# Patient Record
Sex: Female | Born: 1995 | Hispanic: No | Marital: Single | Smoking: Never smoker
Health system: Southern US, Community
[De-identification: ages and names within clinical notes are randomized; demographics above are authoritative.]

## PROBLEM LIST (undated history)

## (undated) DIAGNOSIS — E119 Type 2 diabetes mellitus without complications: Secondary | ICD-10-CM

## (undated) HISTORY — PX: NO PAST SURGERIES: SHX2092

---
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
## 2015-05-31 ENCOUNTER — Encounter (HOSPITAL_COMMUNITY): Payer: Self-pay | Admitting: Emergency Medicine

## 2015-05-31 ENCOUNTER — Emergency Department (HOSPITAL_COMMUNITY)
Admission: EM | Admit: 2015-05-31 | Discharge: 2015-05-31 | Disposition: A | Discharge status: Home / Self Care | Payer: Medicaid Other | Attending: Emergency Medicine | Admitting: Emergency Medicine

## 2015-05-31 DIAGNOSIS — B9689 Other specified bacterial agents as the cause of diseases classified elsewhere: Secondary | ICD-10-CM

## 2015-05-31 DIAGNOSIS — K529 Noninfective gastroenteritis and colitis, unspecified: Secondary | ICD-10-CM | POA: Insufficient documentation

## 2015-05-31 DIAGNOSIS — Z3202 Encounter for pregnancy test, result negative: Secondary | ICD-10-CM | POA: Insufficient documentation

## 2015-05-31 DIAGNOSIS — N760 Acute vaginitis: Secondary | ICD-10-CM | POA: Diagnosis not present

## 2015-05-31 DIAGNOSIS — E119 Type 2 diabetes mellitus without complications: Secondary | ICD-10-CM | POA: Diagnosis not present

## 2015-05-31 DIAGNOSIS — R112 Nausea with vomiting, unspecified: Secondary | ICD-10-CM | POA: Diagnosis present

## 2015-05-31 HISTORY — DX: Type 2 diabetes mellitus without complications: E11.9

## 2015-05-31 LAB — COMPREHENSIVE METABOLIC PANEL
ALK PHOS: 62 U/L (ref 38–126)
ALT: 12 U/L — ABNORMAL LOW (ref 14–54)
ANION GAP: 8 (ref 5–15)
AST: 16 U/L (ref 15–41)
Albumin: 3.3 g/dL — ABNORMAL LOW (ref 3.5–5.0)
BILIRUBIN TOTAL: 0.4 mg/dL (ref 0.3–1.2)
BUN: 11 mg/dL (ref 6–20)
CALCIUM: 8.9 mg/dL (ref 8.9–10.3)
CO2: 25 mmol/L (ref 22–32)
CREATININE: 0.59 mg/dL (ref 0.44–1.00)
Chloride: 101 mmol/L (ref 101–111)
GFR calc non Af Amer: >60 mL/min (ref >60)
Glucose, Bld: 344 mg/dL — ABNORMAL HIGH (ref 65–99)
Potassium: 3.6 mmol/L (ref 3.5–5.1)
Sodium: 134 mmol/L — ABNORMAL LOW (ref 135–145)
TOTAL PROTEIN: 6.3 g/dL — AB (ref 6.5–8.1)

## 2015-05-31 LAB — CBC
HCT: 38 % (ref 36.0–46.0)
HEMOGLOBIN: 11.8 g/dL — AB (ref 12.0–15.0)
MCH: 23 pg — ABNORMAL LOW (ref 26.0–34.0)
MCHC: 31.1 g/dL (ref 30.0–36.0)
MCV: 73.9 fL — ABNORMAL LOW (ref 78.0–100.0)
PLATELETS: 281 10*3/uL (ref 150–400)
RBC: 5.14 MIL/uL — AB (ref 3.87–5.11)
RDW: 15.7 % — ABNORMAL HIGH (ref 11.5–15.5)
WBC: 8.6 10*3/uL (ref 4.0–10.5)

## 2015-05-31 LAB — URINE MICROSCOPIC-ADD ON

## 2015-05-31 LAB — URINALYSIS, ROUTINE W REFLEX MICROSCOPIC
BILIRUBIN URINE: NEGATIVE
Glucose, UA: >1000 mg/dL — AB
Hgb urine dipstick: NEGATIVE
Ketones, ur: NEGATIVE mg/dL
NITRITE: NEGATIVE
PROTEIN: NEGATIVE mg/dL
SPECIFIC GRAVITY, URINE: 1.039 — AB (ref 1.005–1.030)
pH: 5 (ref 5.0–8.0)

## 2015-05-31 LAB — I-STAT BETA HCG BLOOD, ED (MC, WL, AP ONLY)

## 2015-05-31 LAB — LIPASE, BLOOD: Lipase: 24 U/L (ref 11–51)

## 2015-05-31 MED ORDER — METRONIDAZOLE 500 MG PO TABS: 500.0000 mg | ORAL_TABLET | Freq: Two times a day (BID) | ORAL | Status: DC

## 2015-05-31 MED ORDER — ONDANSETRON HCL 4 MG PO TABS
4.0000 mg | ORAL_TABLET | Freq: Once | ORAL | Status: AC
  Administered 2015-05-31: 4 mg via ORAL
  Filled 2015-05-31: qty 1

## 2015-05-31 MED ORDER — ONDANSETRON HCL 4 MG PO TABS: 4.0000 mg | ORAL_TABLET | Freq: Four times a day (QID) | ORAL | Status: DC

## 2015-05-31 NOTE — ED Provider Notes (Signed)
CSN: [id number]     Arrival date & time 05/31/15  1419 History  By signing my name below, I, Placido Sou, attest that this documentation has been prepared under the direction and in the presence of [employer], PA-C. Electronically Signed: Placido Sou, ED Scribe. 05/31/2015. 5:31 PM.    Chief Complaint  Patient presents with  . Emesis  . Diarrhea   The history is provided by the patient. No language interpreter was used.    HPI Comments: Tanya Torres is a 20 y.o. female who presents to the Emergency Department complaining of n/v/d with onset 1 day ago. She notes associated, 5/10, RLQ abd pain when vomiting and with palpation that all but alleviates s/p each episode. She notes 4x vomiting and 4x diarrhea in the past 24 hours. Pt denies any sick contacts. She denies fevers, chills, vaginal discharge or any other associated symptoms at this time.   Past Medical History  Diagnosis Date  . Diabetes mellitus without complication (HCC)    History reviewed. No pertinent past surgical history. History reviewed. No pertinent family history. Social History  Substance Use Topics  . Smoking status: Never Smoker   . Smokeless tobacco: None  . Alcohol Use: No   OB History    No data available     Review of Systems  All other systems reviewed and are negative.  Allergies  Review of patient's allergies indicates no known allergies.  Home Medications   Prior to Admission medications   Medication Sig Start Date End Date Taking? Authorizing Provider  metroNIDAZOLE (FLAGYL) 500 MG tablet Take 1 tablet (500 mg total) by mouth 2 (two) times daily. 05/31/15   Tinnie Gens Zainab Crumrine, PA-C  ondansetron (ZOFRAN) 4 MG tablet Take 1 tablet (4 mg total) by mouth every 6 (six) hours. 05/31/15   Orra Nolde, PA-C   BP 124/83 mmHg  Pulse 82  Temp(Src) 98.1 F (36.7 C) (Oral)  Resp 16  Ht 4\' 11"  (1.499 m)  Wt 94.938 kg  BMI 42.25 kg/m2  SpO2 98%    Physical Exam  Constitutional: She is  oriented to person, place, and time. She appears well-developed and well-nourished.  HENT:  Head: Normocephalic and atraumatic.  Neck: Normal range of motion.  Cardiovascular: Normal rate.   Pulmonary/Chest: Effort normal and breath sounds normal. No respiratory distress. She has no wheezes. She has no rales.  Abdominal: Soft. She exhibits no mass. There is tenderness. There is no rebound and no guarding.  Very minor TTP of the RLQ  Musculoskeletal: Normal range of motion.  Neurological: She is alert and oriented to person, place, and time.  Skin: Skin is warm and dry. She is not diaphoretic.  Psychiatric: She has a normal mood and affect. Her behavior is normal.  Nursing note and vitals reviewed.   ED Course  Procedures  DIAGNOSTIC STUDIES: Oxygen Saturation is 98% on RA, normal by my interpretation.    COORDINATION OF CARE: 5:14 PM Pt presents today due to vomiting and diarrhea. Discussed next steps with pt including Zofran and reevaluation after food and drink. Pt understands and is agreeable to the plan.   6:23 PM Pt reevaluated and was tolerating food and drink well and requests discharge. She refused pelvic exam and would like to follow up with her PCP regarding STD testing.   Labs Review Labs Reviewed  COMPREHENSIVE METABOLIC PANEL - Abnormal; Notable for the following:    Sodium 134 (*)    Glucose, Bld 344 (*)    Total Protein  6.3 (*)    Albumin 3.3 (*)    ALT 12 (*)    All other components within normal limits  CBC - Abnormal; Notable for the following:    RBC 5.14 (*)    Hemoglobin 11.8 (*)    MCV 73.9 (*)    MCH 23.0 (*)    RDW 15.7 (*)    All other components within normal limits  URINALYSIS, ROUTINE W REFLEX MICROSCOPIC (NOT AT [facility]) - Abnormal; Notable for the following:    APPearance HAZY (*)    Specific Gravity, Urine 1.039 (*)    Glucose, UA >1000 (*)    Leukocytes, UA SMALL (*)    All other components within normal limits  URINE MICROSCOPIC-ADD ON -  Abnormal; Notable for the following:    Squamous Epithelial / LPF 6-30 (*)    Bacteria, UA FEW (*)    All other components within normal limits  LIPASE, BLOOD  I-STAT BETA HCG BLOOD, ED ([facility], [facility], [facility] ONLY)    Imaging Review No results found. I have personally reviewed and evaluated these lab results as part of my medical decision-making.   EKG Interpretation None      MDM   Final diagnoses:  Gastroenteritis  Bacterial vaginosis    Labs: UA, urine microscopic, I-stat, lipase, CMP, CBC  Imaging: none indicated  Consults: none  Therapeutics: Zofran , metronidazole  Assessment: Patient's presentation most consistent with gastroenteritis. Nausea vomiting and diarrhea, she is afebrile, nontoxic with reassuring vital signs. Incidentally on urine she was noted to have trichomoniasis. I discussed potential for PID, bacterial infections, and need for pelvic exam. Patient reports that she would like to have her primary care perform the pelvic exam and refused evaluation here. Patient will be given metronidazole, Zofran as needed.   Plan: Pt given strict return precautions, verbalized understanding and agreement to today's plan and had no further questions or concerns at the time of discharge.   I personally performed the services described in this documentation, which was scribed in my presence. The recorded information has been reviewed and is accurate.    Eyvonne MechanicJeffrey Maryam Feely, PA-C 05/31/15 1837  Benjiman CoreNathan Pickering, MD 05/31/15 [phone]

## 2015-05-31 NOTE — Discharge Instructions (Signed)
Please read attached information. If you experience any new or worsening signs or symptoms please return to the emergency room for evaluation. Please follow-up with your primary care provider or specialist as discussed. Please use medication prescribed only as directed and discontinue taking if you have any concerning signs or symptoms.   °

## 2015-05-31 NOTE — ED Notes (Signed)
Pt A&OX4, ambulatory at d/c with steady gait, NAD and she reports she has all of her belongings with her at d/c 

## 2015-05-31 NOTE — ED Notes (Signed)
Pt sts N/V/D x 1 day

## 2017-01-13 ENCOUNTER — Encounter (HOSPITAL_COMMUNITY): Payer: Self-pay | Admitting: Emergency Medicine

## 2017-01-13 ENCOUNTER — Emergency Department (HOSPITAL_COMMUNITY)
Admission: EM | Admit: 2017-01-13 | Discharge: 2017-01-13 | Disposition: A | Discharge status: Home / Self Care | Payer: No Typology Code available for payment source | Attending: Emergency Medicine | Admitting: Emergency Medicine

## 2017-01-13 DIAGNOSIS — M7918 Myalgia, other site: Secondary | ICD-10-CM

## 2017-01-13 DIAGNOSIS — Y92410 Unspecified street and highway as the place of occurrence of the external cause: Secondary | ICD-10-CM | POA: Diagnosis not present

## 2017-01-13 DIAGNOSIS — Y9389 Activity, other specified: Secondary | ICD-10-CM | POA: Insufficient documentation

## 2017-01-13 DIAGNOSIS — Y999 Unspecified external cause status: Secondary | ICD-10-CM | POA: Diagnosis not present

## 2017-01-13 DIAGNOSIS — Z79899 Other long term (current) drug therapy: Secondary | ICD-10-CM | POA: Diagnosis not present

## 2017-01-13 DIAGNOSIS — M791 Myalgia: Secondary | ICD-10-CM | POA: Insufficient documentation

## 2017-01-13 DIAGNOSIS — E119 Type 2 diabetes mellitus without complications: Secondary | ICD-10-CM | POA: Insufficient documentation

## 2017-01-13 MED ORDER — METHOCARBAMOL 500 MG PO TABS: 500.0000 mg | ORAL_TABLET | Freq: Two times a day (BID) | ORAL | 0 refills | Status: DC

## 2017-01-13 MED ORDER — IBUPROFEN 200 MG PO TABS
600.0000 mg | ORAL_TABLET | Freq: Once | ORAL | Status: AC
  Administered 2017-01-13: 600 mg via ORAL
  Filled 2017-01-13: qty 3

## 2017-01-13 MED ORDER — IBUPROFEN 600 MG PO TABS: 600.0000 mg | ORAL_TABLET | Freq: Four times a day (QID) | ORAL | 0 refills | Status: DC | PRN

## 2017-01-13 NOTE — Discharge Instructions (Signed)
Return if any problems.
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
## 2018-08-22 ENCOUNTER — Other Ambulatory Visit: Payer: Self-pay

## 2018-08-22 ENCOUNTER — Encounter (HOSPITAL_COMMUNITY): Payer: Self-pay | Admitting: Emergency Medicine

## 2018-08-22 ENCOUNTER — Emergency Department (HOSPITAL_COMMUNITY): Payer: Self-pay

## 2018-08-22 ENCOUNTER — Emergency Department (HOSPITAL_COMMUNITY)
Admission: EM | Admit: 2018-08-22 | Discharge: 2018-08-22 | Disposition: A | Discharge status: Home / Self Care | Payer: Self-pay | Attending: Emergency Medicine | Admitting: Emergency Medicine

## 2018-08-22 DIAGNOSIS — N390 Urinary tract infection, site not specified: Secondary | ICD-10-CM | POA: Insufficient documentation

## 2018-08-22 DIAGNOSIS — R1010 Upper abdominal pain, unspecified: Secondary | ICD-10-CM

## 2018-08-22 DIAGNOSIS — R1012 Left upper quadrant pain: Secondary | ICD-10-CM | POA: Insufficient documentation

## 2018-08-22 DIAGNOSIS — R1031 Right lower quadrant pain: Secondary | ICD-10-CM | POA: Insufficient documentation

## 2018-08-22 LAB — URINALYSIS, ROUTINE W REFLEX MICROSCOPIC
BILIRUBIN URINE: NEGATIVE
GLUCOSE, UA: NEGATIVE mg/dL
KETONES UR: NEGATIVE mg/dL
NITRITE: NEGATIVE
PH: 5 (ref 5.0–8.0)
Protein, ur: NEGATIVE mg/dL
SPECIFIC GRAVITY, URINE: 1.016 (ref 1.005–1.030)

## 2018-08-22 LAB — COMPREHENSIVE METABOLIC PANEL
ALBUMIN: 3.9 g/dL (ref 3.5–5.0)
ALT: 30 U/L (ref 0–44)
ANION GAP: 8 (ref 5–15)
AST: 45 U/L — AB (ref 15–41)
Alkaline Phosphatase: 70 U/L (ref 38–126)
BILIRUBIN TOTAL: 1.7 mg/dL — AB (ref 0.3–1.2)
BUN: 9 mg/dL (ref 6–20)
CHLORIDE: 104 mmol/L (ref 98–111)
CO2: 25 mmol/L (ref 22–32)
Calcium: 9.6 mg/dL (ref 8.9–10.3)
Creatinine, Ser: 0.73 mg/dL (ref 0.44–1.00)
GFR calc Af Amer: >60 mL/min (ref >60)
Glucose, Bld: 151 mg/dL — ABNORMAL HIGH (ref 70–99)
POTASSIUM: 4.6 mmol/L (ref 3.5–5.1)
Sodium: 137 mmol/L (ref 135–145)
TOTAL PROTEIN: 7.5 g/dL (ref 6.5–8.1)

## 2018-08-22 LAB — CBC
HEMATOCRIT: 43 % (ref 36.0–46.0)
Hemoglobin: 13.4 g/dL (ref 12.0–15.0)
MCH: 24 pg — ABNORMAL LOW (ref 26.0–34.0)
MCHC: 31.2 g/dL (ref 30.0–36.0)
MCV: 77.1 fL — AB (ref 80.0–100.0)
NRBC: 0 % (ref 0.0–0.2)
PLATELETS: 310 10*3/uL (ref 150–400)
RBC: 5.58 MIL/uL — ABNORMAL HIGH (ref 3.87–5.11)
RDW: 14.8 % (ref 11.5–15.5)
WBC: 9.4 10*3/uL (ref 4.0–10.5)

## 2018-08-22 LAB — LIPASE, BLOOD: LIPASE: 23 U/L (ref 11–51)

## 2018-08-22 LAB — PREGNANCY, URINE: Preg Test, Ur: NEGATIVE

## 2018-08-22 MED ORDER — CEPHALEXIN 500 MG PO CAPS: 500.0000 mg | ORAL_CAPSULE | Freq: Four times a day (QID) | ORAL | 0 refills | Status: DC

## 2018-08-22 MED ORDER — CEPHALEXIN 250 MG PO CAPS
500.0000 mg | ORAL_CAPSULE | Freq: Once | ORAL | Status: AC
  Administered 2018-08-22: 500 mg via ORAL
  Filled 2018-08-22: qty 2

## 2018-08-22 NOTE — ED Provider Notes (Signed)
[facility] EMERGENCY DEPARTMENT Provider Note   CSN: [id number] Arrival date & time: 08/22/18  1449    History   Chief Complaint Chief Complaint  Patient presents with  . Abdominal Pain    HPI Tanya Torres is a 23 y.o. female.     Patient c/o intermittent left upper and right lower abd pain for the past few days. Symptoms gradual onset, moderate, episodic, non radiating. Denies hx same pain. No specific exacerbating or alleviating factors. Not related to eating. Having normal bms including today. No abd distension or prior abd surgery. No vaginal discharge or bleeding. Last normal period approx 6 weeks ago - pt indicates took home preg test neg. Denies dysuria or hematuria. No fever or chills. Denies cough or uri symptoms.   The history is provided by the patient.  Abdominal Pain  Associated symptoms: no chest pain, no chills, no constipation, no cough, no diarrhea, no dysuria, no fever, no shortness of breath, no sore throat and no vomiting     Past Medical History:  Diagnosis Date  . Diabetes mellitus without complication (HCC)     There are no active problems to display for this patient.   History reviewed. No pertinent surgical history.   OB History   No obstetric history on file.      Home Medications    Prior to Admission medications   Medication Sig Start Date End Date Taking? Authorizing Provider  ibuprofen (ADVIL,MOTRIN) 600 MG tablet Take 1 tablet (600 mg total) by mouth every 6 (six) hours as needed. 01/13/17   Elson Areas, PA-C  methocarbamol (ROBAXIN) 500 MG tablet Take 1 tablet (500 mg total) by mouth 2 (two) times daily. 01/13/17   Elson Areas, PA-C  metroNIDAZOLE (FLAGYL) 500 MG tablet Take 1 tablet (500 mg total) by mouth 2 (two) times daily. 05/31/15   Hedges, Tinnie Gens, PA-C  ondansetron (ZOFRAN) 4 MG tablet Take 1 tablet (4 mg total) by mouth every 6 (six) hours. 05/31/15   Eyvonne Mechanic, PA-C    Family History No family  history on file.  Social History Social History   Tobacco Use  . Smoking status: Never Smoker  . Smokeless tobacco: Never Used  Substance Use Topics  . Alcohol use: Yes    Comment: occasionally  . Drug use: No     Allergies   Patient has no known allergies.   Review of Systems Review of Systems  Constitutional: Negative for chills and fever.  HENT: Negative for sore throat.   Eyes: Negative for redness.  Respiratory: Negative for cough and shortness of breath.   Cardiovascular: Negative for chest pain.  Gastrointestinal: Positive for abdominal pain. Negative for constipation, diarrhea and vomiting.  Endocrine: Negative for polyuria.  Genitourinary: Negative for dysuria and flank pain.  Musculoskeletal: Negative for back pain and neck pain.  Skin: Negative for rash.  Neurological: Negative for headaches.  Hematological: Does not bruise/bleed easily.  Psychiatric/Behavioral: Negative for confusion.     Physical Exam Updated Vital Signs BP (!) 139/93   Pulse 100   Temp 98.4 F (36.9 C) (Oral)   Resp 16   SpO2 100%   Physical Exam Vitals signs and nursing note reviewed.  Constitutional:      Appearance: Normal appearance. She is well-developed.  HENT:     Head: Atraumatic.     Nose: Nose normal.     Mouth/Throat:     Mouth: Mucous membranes are moist.  Eyes:     General:  No scleral icterus.    Conjunctiva/sclera: Conjunctivae normal.  Neck:     Musculoskeletal: Normal range of motion and neck supple. No neck rigidity or muscular tenderness.     Trachea: No tracheal deviation.  Cardiovascular:     Rate and Rhythm: Normal rate and regular rhythm.     Pulses: Normal pulses.     Heart sounds: Normal heart sounds. No murmur. No friction rub. No gallop.   Pulmonary:     Effort: Pulmonary effort is normal. No respiratory distress.     Breath sounds: Normal breath sounds.  Abdominal:     General: Bowel sounds are normal. There is no distension.      Palpations: Abdomen is soft. There is no mass.     Tenderness: There is no abdominal tenderness. There is no guarding or rebound.     Hernia: No hernia is present.  Genitourinary:    Comments: No cva tenderness.  Musculoskeletal:        General: No swelling.  Skin:    General: Skin is warm and dry.     Findings: No rash.  Neurological:     Mental Status: She is alert.     Comments: Alert, speech normal.   Psychiatric:        Mood and Affect: Mood normal.      ED Treatments / Results  Labs (all labs ordered are listed, but only abnormal results are displayed) Results for orders placed or performed during the hospital encounter of 08/22/18  CBC  Result Value Ref Range   WBC 9.4 4.0 - 10.5 K/uL   RBC 5.58 (H) 3.87 - 5.11 MIL/uL   Hemoglobin 13.4 12.0 - 15.0 g/dL   HCT 25.6 38.9 - 37.3 %   MCV 77.1 (L) 80.0 - 100.0 fL   MCH 24.0 (L) 26.0 - 34.0 pg   MCHC 31.2 30.0 - 36.0 g/dL   RDW 42.8 76.8 - 11.5 %   Platelets 310 150 - 400 K/uL   nRBC 0.0 0.0 - 0.2 %  Comprehensive metabolic panel  Result Value Ref Range   Sodium 137 135 - 145 mmol/L   Potassium 4.6 3.5 - 5.1 mmol/L   Chloride 104 98 - 111 mmol/L   CO2 25 22 - 32 mmol/L   Glucose, Bld 151 (H) 70 - 99 mg/dL   BUN 9 6 - 20 mg/dL   Creatinine, Ser 7.26 0.44 - 1.00 mg/dL   Calcium 9.6 8.9 - 20.3 mg/dL   Total Protein 7.5 6.5 - 8.1 g/dL   Albumin 3.9 3.5 - 5.0 g/dL   AST 45 (H) 15 - 41 U/L   ALT 30 0 - 44 U/L   Alkaline Phosphatase 70 38 - 126 U/L   Total Bilirubin 1.7 (H) 0.3 - 1.2 mg/dL   GFR calc non Af Amer >60 >60 mL/min   GFR calc Af Amer >60 >60 mL/min   Anion gap 8 5 - 15  Lipase, blood  Result Value Ref Range   Lipase 23 11 - 51 U/L  Urinalysis, Routine w reflex microscopic  Result Value Ref Range   Color, Urine YELLOW YELLOW   APPearance HAZY (A) CLEAR   Specific Gravity, Urine 1.016 1.005 - 1.030   pH 5.0 5.0 - 8.0   Glucose, UA NEGATIVE NEGATIVE mg/dL   Hgb urine dipstick SMALL (A) NEGATIVE    Bilirubin Urine NEGATIVE NEGATIVE   Ketones, ur NEGATIVE NEGATIVE mg/dL   Protein, ur NEGATIVE NEGATIVE mg/dL   Nitrite NEGATIVE NEGATIVE  Leukocytes,Ua MODERATE (A) NEGATIVE   RBC / HPF 6-10 0 - 5 RBC/hpf   WBC, UA 11-20 0 - 5 WBC/hpf   Bacteria, UA FEW (A) NONE SEEN   Squamous Epithelial / LPF 6-10 0 - 5   Mucus PRESENT   Pregnancy, urine  Result Value Ref Range   Preg Test, Ur NEGATIVE NEGATIVE    EKG None  Radiology [place] Abdomen Limited  Result Date: 08/22/2018 CLINICAL DATA:  Upper abdominal pain.  Evaluate for gallstones. EXAM: ULTRASOUND ABDOMEN LIMITED RIGHT UPPER QUADRANT COMPARISON:  None. FINDINGS: Gallbladder: No gallstones or wall thickening visualized. No sonographic Murphy sign noted by sonographer. Common bile duct: Diameter: 2.9 mm Liver: No focal lesion identified. Within normal limits in parenchymal echogenicity. Portal vein is patent on color Doppler imaging with normal direction of blood flow towards the liver. IMPRESSION: Negative. Electronically Signed   By: Elsie StainJohn T Curnes M.D.   On: 08/22/2018 18:15    Procedures Procedures (including critical care time)  Medications Ordered in ED Medications - No data to display   Initial Impression / Assessment and Plan / ED Course  I have reviewed the triage vital signs and the nursing notes.  Pertinent labs & imaging results that were available during my care of the patient were reviewed by me and considered in my medical decision making (see chart for details).  Labs sent.   Reviewed nursing notes and prior charts for additional history.   Labs reviewed - possible uti, LE pos, 11-20 wbc - will tx.  Wbc normal, lytes normal. u preg negative.   Given upper abd pain will get u/s r/o gallstones.   Recheck abd soft nt. No nv. Afebrile.   U/s reviewed - no gallstones, negative acute.  Keflex po. rx for home.  Abd soft nt.  Pt appears stable for d/c.    Final Clinical Impressions(s) / ED Diagnoses   Final  diagnoses:  None    ED Discharge Orders    None       Cathren LaineSteinl, Telina Kleckley, MD 08/22/18 1850

## 2018-08-22 NOTE — ED Notes (Signed)
Discharge instructions and prescription discussed with Pt. Pt verbalized understanding. Pt stable and ambulatory.    

## 2018-08-22 NOTE — ED Triage Notes (Addendum)
Lower abd pain  X a couple days some nausea, and her cycle is 8 days late , denies dysuria, last bm normal this am ,  Took home preg test  Not preg she states deines vag d/c

## 2018-08-22 NOTE — Discharge Instructions (Addendum)
It was our pleasure to provide your ER care today - we hope that you feel better.  Your lab tests show a possible urine infection - take antibiotic as prescribed.   From the lab tests, a couple of your liver tests were mildly elevated (AST 45, bilirubin 1.7) - follow up with primary care doctor.   Return to ER if worse, new symptoms, severe pain, persistent vomiting, other concern.

## 2019-08-31 ENCOUNTER — Encounter (HOSPITAL_COMMUNITY): Payer: Self-pay | Admitting: Emergency Medicine

## 2019-08-31 ENCOUNTER — Emergency Department (HOSPITAL_COMMUNITY)
Admission: EM | Admit: 2019-08-31 | Discharge: 2019-08-31 | Disposition: A | Discharge status: Home / Self Care | Payer: Medicaid Other | Attending: Emergency Medicine | Admitting: Emergency Medicine

## 2019-08-31 DIAGNOSIS — L03012 Cellulitis of left finger: Secondary | ICD-10-CM | POA: Insufficient documentation

## 2019-08-31 DIAGNOSIS — E119 Type 2 diabetes mellitus without complications: Secondary | ICD-10-CM | POA: Insufficient documentation

## 2019-08-31 MED ORDER — SULFAMETHOXAZOLE-TRIMETHOPRIM 800-160 MG PO TABS: 1.0000 | ORAL_TABLET | Freq: Two times a day (BID) | ORAL | 0 refills | Status: AC

## 2019-08-31 MED ORDER — ACETAMINOPHEN 325 MG PO TABS
650.0000 mg | ORAL_TABLET | Freq: Once | ORAL | Status: AC
  Administered 2019-08-31: 650 mg via ORAL
  Filled 2019-08-31: qty 2

## 2019-08-31 MED ORDER — BUPIVACAINE HCL (PF) 0.5 % IJ SOLN
10.0000 mL | Freq: Once | INTRAMUSCULAR | Status: AC
  Administered 2019-08-31: 17:00:00 10 mL
  Filled 2019-08-31: qty 10

## 2019-08-31 MED ORDER — IBUPROFEN 800 MG PO TABS
800.0000 mg | ORAL_TABLET | Freq: Once | ORAL | Status: AC
  Administered 2019-08-31: 17:00:00 800 mg via ORAL
  Filled 2019-08-31: qty 1

## 2019-08-31 NOTE — Discharge Instructions (Signed)
Per our discussion, you are being prescribed Bactrim which is an antibiotic for the infection in your finger.  Please remember that this medication is not safe to take if you are pregnant.  Based on our conversation you are certain that you are not pregnant.  If there is any concern, please be sure to take a pregnancy test and make sure it is negative.  Please continue to apply warm compresses to the finger as needed.  Also, please take ibuprofen and acetaminophen as needed for the pain in your finger.  Please return to the emergency department with any new or worsening symptoms.

## 2019-08-31 NOTE — ED Triage Notes (Signed)
Pt reports pain to left middle finger about a week ago. Pt started soaking finger and noticed some swelling.

## 2019-08-31 NOTE — ED Provider Notes (Signed)
[facility] EMERGENCY DEPARTMENT Provider Note   CSN: [id number] Arrival date & time: 08/31/19  1505     History Chief Complaint  Patient presents with  . Finger Injury    Taiwana Willison is a 24 y.o. female.  HPI HPI Comments: Shona Pardo is a 24 y.o. female who presents to the Emergency Department complaining of worsening pain and swelling at the distal tip of the left third digit.  Patient states her symptoms started about 4 days ago and then noted a small pustule just proximal to the nailbed on the affected finger about 3 days ago.  She has been applying warm compresses to the region and taking ibuprofen 800 mg 3 times daily without significant relief.  Her last dose of ibuprofen was yesterday.  She states her pain is 10/10 and is throbbing.  She does not bite her nails and denies going to nail salon's.  She does have a history of diabetes mellitus.  She denies any other complaints including fevers, chills, URI symptoms, chest pain, shortness of breath, abdominal pain, nausea, vomiting, diarrhea.    Past Medical History:  Diagnosis Date  . Diabetes mellitus without complication (HCC)     There are no problems to display for this patient.   History reviewed. No pertinent surgical history.   OB History   No obstetric history on file.     No family history on file.  Social History   Tobacco Use  . Smoking status: Never Smoker  . Smokeless tobacco: Never Used  Substance Use Topics  . Alcohol use: Yes    Comment: occasionally  . Drug use: No    Home Medications Prior to Admission medications   Medication Sig Start Date End Date Taking? Authorizing Provider  acetaminophen (TYLENOL) 325 MG tablet Take 650 mg by mouth every 6 (six) hours as needed for mild pain or headache.    [provider]  cephALEXin (KEFLEX) 500 MG capsule Take 1 capsule (500 mg total) by mouth 4 (four) times daily. 08/22/18   Cathren Laine, MD  ibuprofen (ADVIL,MOTRIN)  600 MG tablet Take 1 tablet (600 mg total) by mouth every 6 (six) hours as needed. Patient not taking: Reported on 08/22/2018 01/13/17   Elson Areas, PA-C  insulin aspart (NOVOLOG) 100 UNIT/ML FlexPen Inject 10 Units into the skin daily.    [provider]  insulin glargine (LANTUS) 100 unit/mL SOPN Inject 18 Units into the skin daily.    [provider]  methocarbamol (ROBAXIN) 500 MG tablet Take 1 tablet (500 mg total) by mouth 2 (two) times daily. Patient not taking: Reported on 08/22/2018 01/13/17   Elson Areas, PA-C  metroNIDAZOLE (FLAGYL) 500 MG tablet Take 1 tablet (500 mg total) by mouth 2 (two) times daily. Patient not taking: Reported on 08/22/2018 05/31/15   Hedges, Tinnie Gens, PA-C  ondansetron (ZOFRAN) 4 MG tablet Take 1 tablet (4 mg total) by mouth every 6 (six) hours. Patient not taking: Reported on 08/22/2018 05/31/15   Hedges, Tinnie Gens, PA-C  sulfamethoxazole-trimethoprim (BACTRIM DS) 800-160 MG tablet Take 1 tablet by mouth 2 (two) times daily for 7 days. 08/31/19 09/07/19  Placido Sou, PA-C    Allergies    Patient has no known allergies.  Review of Systems   Review of Systems  Constitutional: Negative for chills and fever.  HENT: Negative for congestion and rhinorrhea.   Respiratory: Negative for shortness of breath.   Cardiovascular: Negative for chest pain.  Gastrointestinal: Negative for abdominal pain,  diarrhea, nausea and vomiting.  Musculoskeletal: Positive for arthralgias and myalgias.  Skin: Positive for color change and wound.   Physical Exam Updated Vital Signs BP 138/86   Pulse 100   Temp 98.2 F (36.8 C) (Oral)   Resp 16   Ht 4\' 11"  (1.499 m)   Wt 97.5 kg   SpO2 98%   BMI 43.42 kg/m   Physical Exam Vitals and nursing note reviewed.  Constitutional:      General: She is in acute distress.     Appearance: Normal appearance. She is not ill-appearing, toxic-appearing or diaphoretic.     Comments: Pleasant well-developed 24 year old  female in significant pain.  She is sitting upright speaking clearly and coherently.  HENT:     Head: Normocephalic and atraumatic.     Nose: Nose normal.  Eyes:     Extraocular Movements: Extraocular movements intact.  Cardiovascular:     Rate and Rhythm: Normal rate.  Pulmonary:     Effort: Pulmonary effort is normal.  Musculoskeletal:        General: Normal range of motion.     Cervical back: Normal range of motion.  Skin:    General: Skin is warm and dry.     Capillary Refill: Capillary refill takes less than 2 seconds.     Comments: Exquisite TTP noted to the distal phalanx of the left third digit.  Unable to assess range of motion due to pain.  Pustule noted just proximal to the nailbed on the lateral aspect.  Mild erythema surrounding this region.  Distal sensation intact.  2+ radial pulses noted bilaterally.  Neurological:     General: No focal deficit present.     Mental Status: She is alert and oriented to person, place, and time.  Psychiatric:        Mood and Affect: Mood normal.        Behavior: Behavior normal.     ED Results / Procedures / Treatments   Labs (all labs ordered are listed, but only abnormal results are displayed) Labs Reviewed - No data to display  EKG None  Radiology No results found.  Procedures .Marland KitchenIncision and Drainage  Date/Time: 08/31/2019 5:24 PM Performed by: Rayna Sexton, PA-C Authorized by: Rayna Sexton, PA-C   Consent:    Consent obtained:  Verbal   Consent given by:  Patient   Risks discussed:  Bleeding, incomplete drainage, pain and infection Location:    Indications for incision and drainage: Paronychia.   Location:  Upper extremity   Upper extremity location:  Finger   Finger location:  L long finger Pre-procedure details:    Skin preparation:  Betadine Anesthesia (see MAR for exact dosages):    Anesthesia method:  Local infiltration   Local anesthetic:  Bupivacaine 0.5% w/o epi Procedure type:    Complexity:   Simple Procedure details:    Incision types:  Stab incision   Incision depth:  Dermal   Scalpel blade:  11   Wound management:  Probed and deloculated and extensive cleaning   Drainage:  Bloody and purulent   Drainage amount:  Scant   Wound treatment:  Wound left open   Packing materials:  None Post-procedure details:    Patient tolerance of procedure:  Tolerated well, no immediate complications    Medications Ordered in ED Medications  acetaminophen (TYLENOL) tablet 650 mg (650 mg Oral Given 08/31/19 1657)  ibuprofen (ADVIL) tablet 800 mg (800 mg Oral Given 08/31/19 1657)  bupivacaine (MARCAINE) 0.5 % injection 10  mL (10 mLs Infiltration Given 08/31/19 1657)   ED Course  I have reviewed the triage vital signs and the nursing notes.  Pertinent labs & imaging results that were available during my care of the patient were reviewed by me and considered in my medical decision making (see chart for details).    MDM Rules/Calculators/A&P                      Patient is a 24 year old African-American female that presents with worsening swelling and pain along the distal phalanx of the left third digit consistent with paronychia.  She has been taking ibuprofen for pain without significant relief.  She was given ibuprofen and APAP in the ED for pain.  Digital block was performed the affected digit with Marcaine.  Wound was incised with an 11 blade showing a moderate amount of purulent discharge.  Patient tolerated the procedure well.  We discussed wound care as well as continuing to apply warm compresses to the region and allowing the wound to drain.  Will place on twice daily Bactrim for 1 week.  She understands that this medication is not safe to take in pregnancy and denies any chance of being pregnant.  I instructed her to take a home pregnancy test if there is any concern.  She can continue to take ibuprofen and APAP at home for pain.  She verbalized understanding of the above plan.  Her questions  were answered.  She was amicable at the time of discharge.  Vital signs stable.  Patient discharged to home/self care.  Condition at discharge: Stable  Note: Portions of this report may have been transcribed using voice recognition software. Every effort was made to ensure accuracy; however, inadvertent computerized transcription errors may be present.    Final Clinical Impression(s) / ED Diagnoses Final diagnoses:  Paronychia of finger, left    Rx / DC Orders ED Discharge Orders         Ordered    sulfamethoxazole-trimethoprim (BACTRIM DS) 800-160 MG tablet  2 times daily     08/31/19 1715           Placido Sou, PA-C 08/31/19 1731    Tegeler, Canary Brim, MD 09/01/19 [phone]

## 2020-03-07 ENCOUNTER — Encounter (HOSPITAL_COMMUNITY): Payer: Self-pay | Admitting: Emergency Medicine

## 2020-03-07 ENCOUNTER — Emergency Department (HOSPITAL_COMMUNITY)
Admission: EM | Admit: 2020-03-07 | Discharge: 2020-03-07 | Disposition: A | Discharge status: Home / Self Care | Payer: Medicaid Other | Attending: Emergency Medicine | Admitting: Emergency Medicine

## 2020-03-07 DIAGNOSIS — R1030 Lower abdominal pain, unspecified: Secondary | ICD-10-CM | POA: Insufficient documentation

## 2020-03-07 DIAGNOSIS — Z5321 Procedure and treatment not carried out due to patient leaving prior to being seen by health care provider: Secondary | ICD-10-CM | POA: Insufficient documentation

## 2020-03-07 LAB — I-STAT CHEM 8, ED
BUN: 8 mg/dL (ref 6–20)
Calcium, Ion: 1.18 mmol/L (ref 1.15–1.40)
Chloride: 103 mmol/L (ref 98–111)
Creatinine, Ser: 0.6 mg/dL (ref 0.44–1.00)
Glucose, Bld: 181 mg/dL — ABNORMAL HIGH (ref 70–99)
HCT: 40 % (ref 36.0–46.0)
Hemoglobin: 13.6 g/dL (ref 12.0–15.0)
Potassium: 3.6 mmol/L (ref 3.5–5.1)
Sodium: 140 mmol/L (ref 135–145)
TCO2: 24 mmol/L (ref 22–32)

## 2020-03-07 LAB — I-STAT BETA HCG BLOOD, ED (MC, WL, AP ONLY): I-stat hCG, quantitative: <5 m[IU]/mL (ref <5)

## 2020-03-07 LAB — URINALYSIS, ROUTINE W REFLEX MICROSCOPIC
Bilirubin Urine: NEGATIVE
Glucose, UA: NEGATIVE mg/dL
Hgb urine dipstick: NEGATIVE
Ketones, ur: 5 mg/dL — AB
Nitrite: NEGATIVE
Protein, ur: NEGATIVE mg/dL
Specific Gravity, Urine: 1.015 (ref 1.005–1.030)
pH: 5 (ref 5.0–8.0)

## 2020-03-07 LAB — COMPREHENSIVE METABOLIC PANEL
ALT: 18 U/L (ref 0–44)
AST: 15 U/L (ref 15–41)
Albumin: 3.5 g/dL (ref 3.5–5.0)
Alkaline Phosphatase: 50 U/L (ref 38–126)
Anion gap: 11 (ref 5–15)
BUN: 7 mg/dL (ref 6–20)
CO2: 24 mmol/L (ref 22–32)
Calcium: 8.9 mg/dL (ref 8.9–10.3)
Chloride: 103 mmol/L (ref 98–111)
Creatinine, Ser: 0.67 mg/dL (ref 0.44–1.00)
GFR, Estimated: >60 mL/min (ref >60)
Glucose, Bld: 179 mg/dL — ABNORMAL HIGH (ref 70–99)
Potassium: 3.5 mmol/L (ref 3.5–5.1)
Sodium: 138 mmol/L (ref 135–145)
Total Bilirubin: 0.4 mg/dL (ref 0.3–1.2)
Total Protein: 6.8 g/dL (ref 6.5–8.1)

## 2020-03-07 LAB — CBC
HCT: 41 % (ref 36.0–46.0)
Hemoglobin: 12.5 g/dL (ref 12.0–15.0)
MCH: 25.2 pg — ABNORMAL LOW (ref 26.0–34.0)
MCHC: 30.5 g/dL (ref 30.0–36.0)
MCV: 82.5 fL (ref 80.0–100.0)
Platelets: 226 10*3/uL (ref 150–400)
RBC: 4.97 MIL/uL (ref 3.87–5.11)
RDW: 15.9 % — ABNORMAL HIGH (ref 11.5–15.5)
WBC: 9.2 10*3/uL (ref 4.0–10.5)
nRBC: 0 % (ref 0.0–0.2)

## 2020-03-07 LAB — LIPASE, BLOOD: Lipase: 22 U/L (ref 11–51)

## 2020-03-07 LAB — CBG MONITORING, ED: Glucose-Capillary: 175 mg/dL — ABNORMAL HIGH (ref 70–99)

## 2020-03-07 NOTE — ED Notes (Signed)
Pt left. 

## 2020-03-07 NOTE — ED Triage Notes (Signed)
Pt states she has been having low abdominal pain for 1 week, she states her period is also 15 days late , has tender breasts so was concerned for pregnancy however has taken several negative tests at home.

## 2020-04-13 IMAGING — US ULTRASOUND ABDOMEN LIMITED
1 series · 14 of 25 positions shown · non-contrast
Comparison: None.

CLINICAL DATA: Upper abdominal pain.  Evaluate for gallstones.

EXAM:
ULTRASOUND ABDOMEN LIMITED RIGHT UPPER QUADRANT

[Series 1: ultrasound abdomen limited · 14 of 42 slices shown]
[im 1/42]
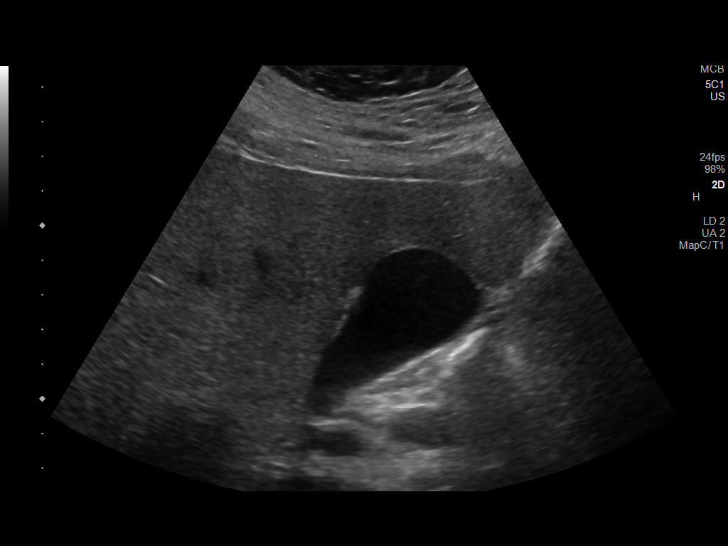
[im 4/42]
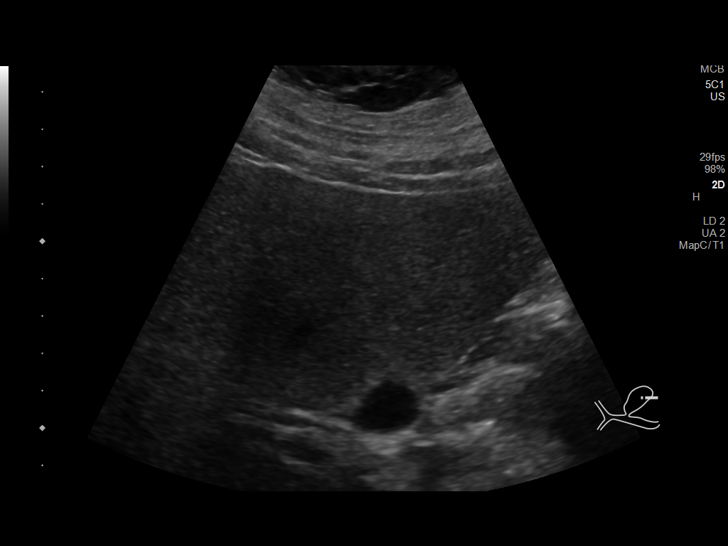
[im 7/42]
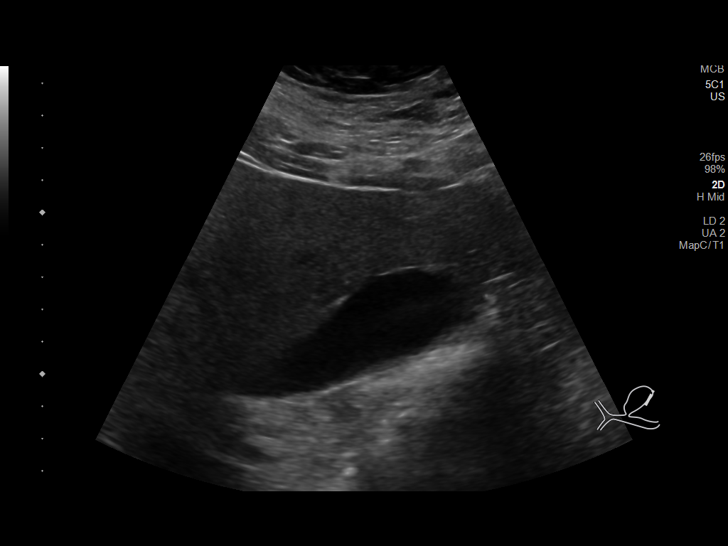
[im 11/42]
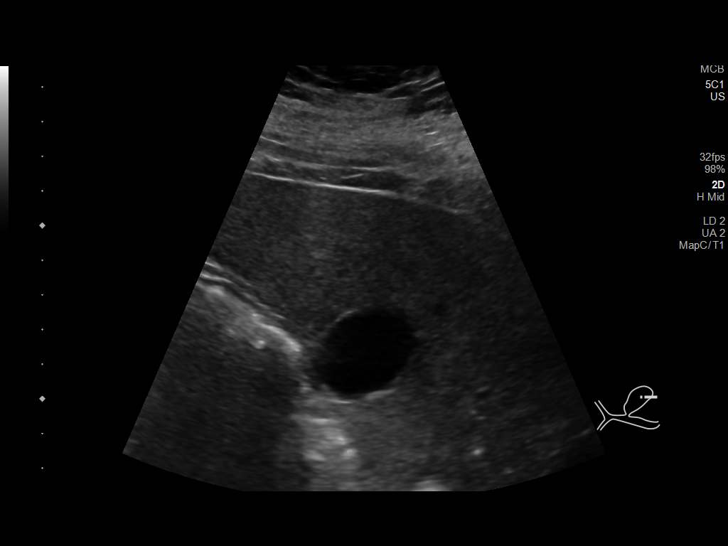
[im 14/42]
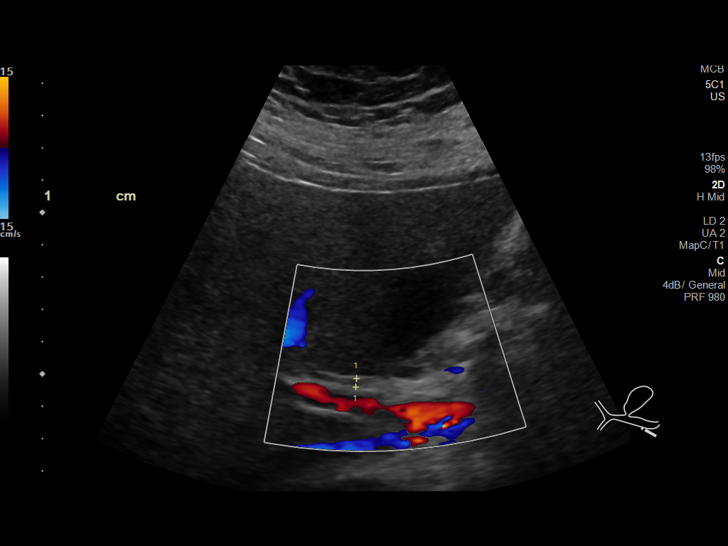
[im 16/42]
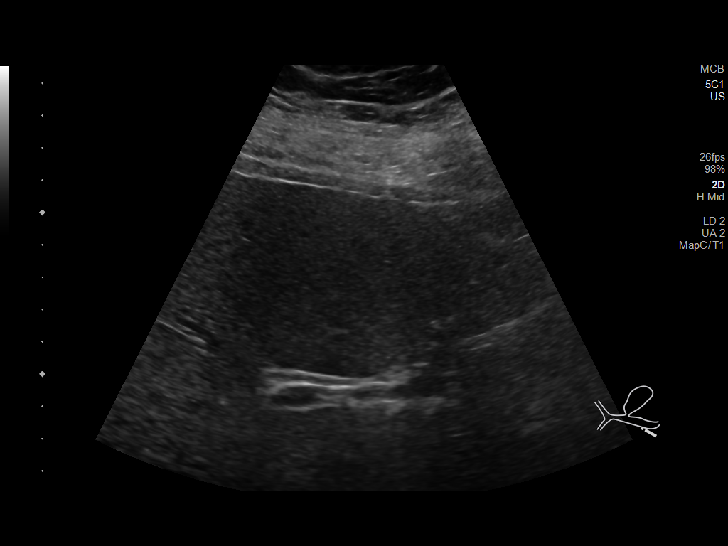
[im 19/42]
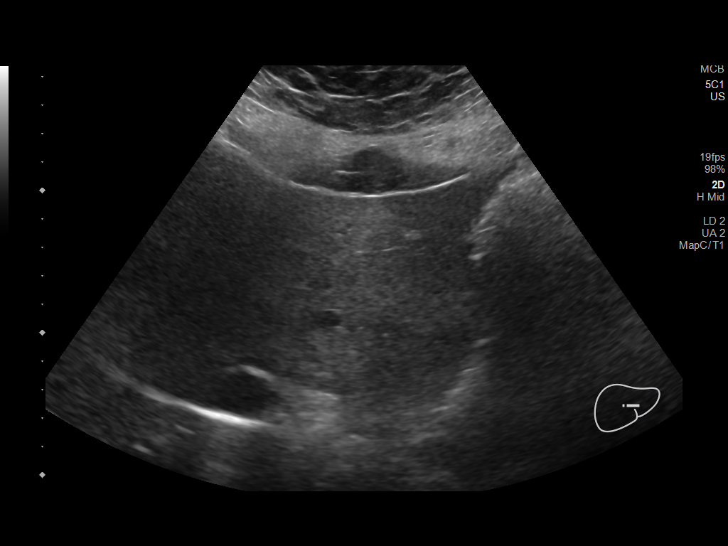
[im 23/42]
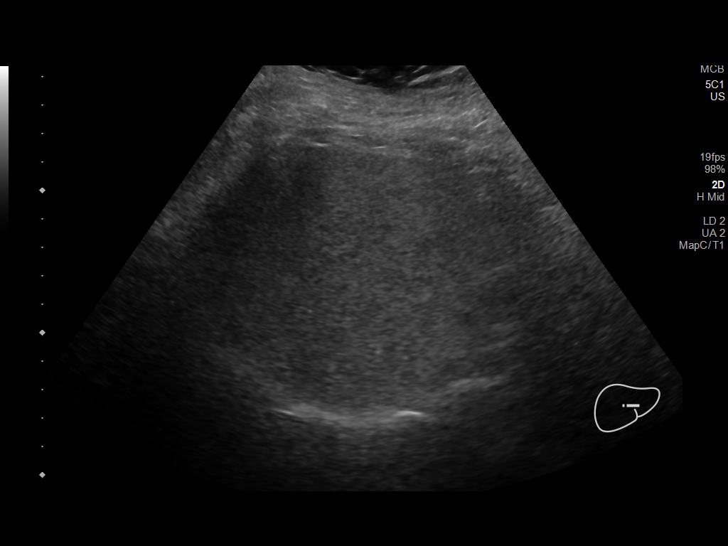
[im 26/42]
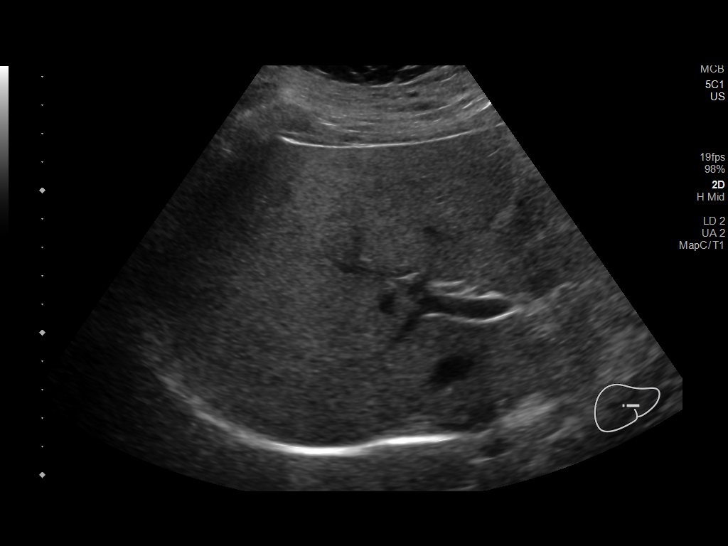
[im 28/42]
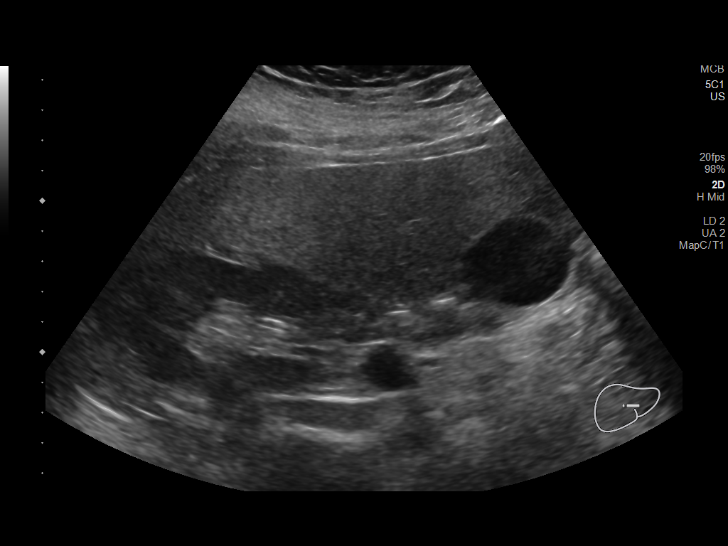
[im 31/42]
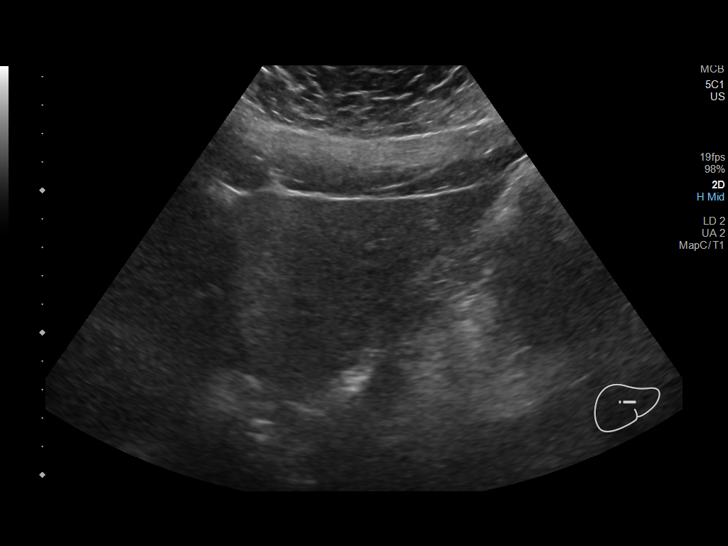
[im 35/42]
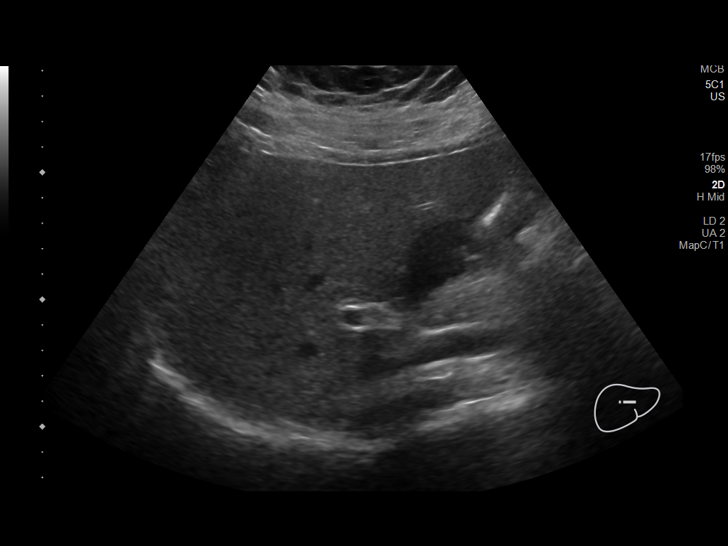
[im 38/42]
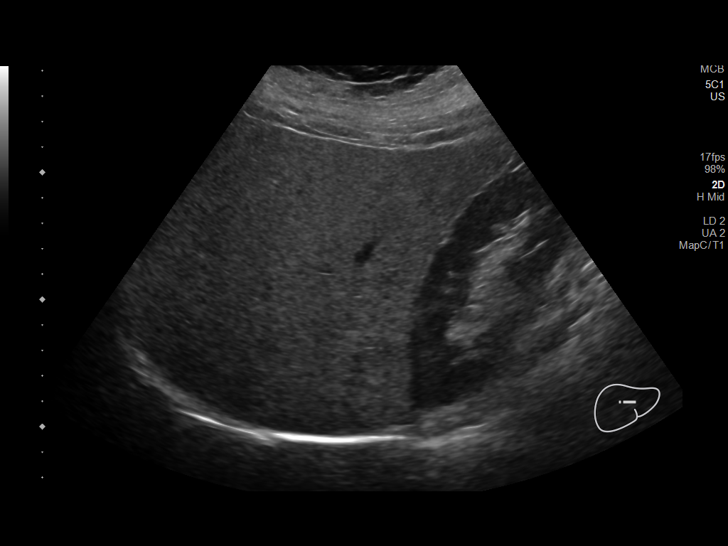
[im 42/42]
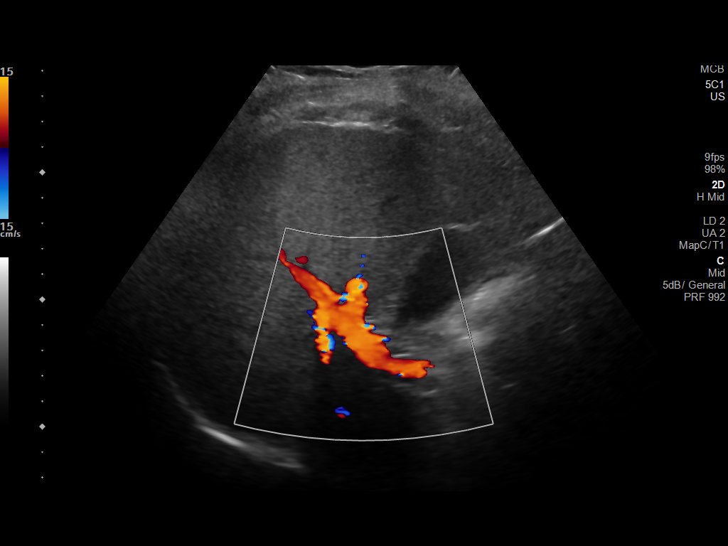

[14 of 25 positions shown; findings below may reference images not displayed]

FINDINGS: Gallbladder:

No gallstones or wall thickening visualized. No sonographic Murphy
sign noted by sonographer.

Common bile duct:

Diameter: 2.9 mm

Liver:

No focal lesion identified. Within normal limits in parenchymal
echogenicity. Portal vein is patent on color Doppler imaging with
normal direction of blood flow towards the liver.
IMPRESSION: Negative.

## 2022-06-22 ENCOUNTER — Encounter (HOSPITAL_COMMUNITY): Payer: Self-pay

## 2022-06-22 ENCOUNTER — Ambulatory Visit (HOSPITAL_COMMUNITY)
Admission: EM | Admit: 2022-06-22 | Discharge: 2022-06-22 | Disposition: A | Discharge status: Home / Self Care | Payer: Self-pay | Attending: Physician Assistant | Admitting: Physician Assistant

## 2022-06-22 DIAGNOSIS — E1169 Type 2 diabetes mellitus with other specified complication: Secondary | ICD-10-CM

## 2022-06-22 DIAGNOSIS — Z113 Encounter for screening for infections with a predominantly sexual mode of transmission: Secondary | ICD-10-CM

## 2022-06-22 DIAGNOSIS — R8290 Unspecified abnormal findings in urine: Secondary | ICD-10-CM

## 2022-06-22 DIAGNOSIS — E119 Type 2 diabetes mellitus without complications: Secondary | ICD-10-CM

## 2022-06-22 LAB — POCT URINALYSIS DIPSTICK, ED / UC
Bilirubin Urine: NEGATIVE
Glucose, UA: >=1000 mg/dL — AB
Leukocytes,Ua: NEGATIVE
Nitrite: POSITIVE — AB
Protein, ur: 30 mg/dL — AB
Specific Gravity, Urine: 1.02 (ref 1.005–1.030)
Urobilinogen, UA: 0.2 mg/dL (ref 0.0–1.0)
pH: 6 (ref 5.0–8.0)

## 2022-06-22 LAB — COMPREHENSIVE METABOLIC PANEL
ALT: 16 U/L (ref 0–44)
AST: 22 U/L (ref 15–41)
Albumin: 3.8 g/dL (ref 3.5–5.0)
Alkaline Phosphatase: 59 U/L (ref 38–126)
Anion gap: 11 (ref 5–15)
BUN: 9 mg/dL (ref 6–20)
CO2: 24 mmol/L (ref 22–32)
Calcium: 9.4 mg/dL (ref 8.9–10.3)
Chloride: 101 mmol/L (ref 98–111)
Creatinine, Ser: 0.65 mg/dL (ref 0.44–1.00)
GFR, Estimated: >60 mL/min (ref >60)
Glucose, Bld: 89 mg/dL (ref 70–99)
Potassium: 4.3 mmol/L (ref 3.5–5.1)
Sodium: 136 mmol/L (ref 135–145)
Total Bilirubin: 0.3 mg/dL (ref 0.3–1.2)
Total Protein: 7.4 g/dL (ref 6.5–8.1)

## 2022-06-22 LAB — CBG MONITORING, ED: Glucose-Capillary: 83 mg/dL (ref 70–99)

## 2022-06-22 LAB — HIV ANTIBODY (ROUTINE TESTING W REFLEX): HIV Screen 4th Generation wRfx: NONREACTIVE

## 2022-06-22 LAB — HEMOGLOBIN A1C
Hgb A1c MFr Bld: 12.1 % — ABNORMAL HIGH (ref 4.8–5.6)
Mean Plasma Glucose: 300.57 mg/dL

## 2022-06-22 LAB — POC URINE PREG, ED: Preg Test, Ur: NEGATIVE

## 2022-06-22 LAB — HEPATITIS C ANTIBODY: HCV Ab: NONREACTIVE

## 2022-06-22 NOTE — ED Triage Notes (Signed)
Pt requesting STD testing. Denies sx's. States her partner is having sx's and was tested but hasn't received his results yet.

## 2022-06-22 NOTE — ED Provider Notes (Signed)
[place]    CSN: [id number] Arrival date & time: 06/22/22  1015      History   Chief Complaint Chief Complaint  Patient presents with   SEXUALLY TRANSMITTED DISEASE    HPI Tanya Torres is a 27 y.o. female.   Patient presents today with several concerns.  Her primary concern today is STI testing.  Reports that approximately 30 days ago she had complete negative STI panel with [facility].  Her significant other has since developed symptoms of STI and so she is requesting repeat testing today.  She denies any current symptoms including pelvic pain, abdominal pain, fever, nausea, vomiting.  She does not believe that she could be pregnant as her last menstrual cycle was 06/05/2022.  Denies any recent antibiotics.  She is open to complete STI panel.  In addition, patient reports that she has a prolonged history of type 2 diabetes but has not been taking any medication for this for several years as she was without insurance.  She recently got insurance through her employer and is interested in undergoing treatment for this condition.  She has previously taken metformin as well as long-acting and sliding scale insulin.  She does try to exercise regularly and avoids refined sugar in her diet that she does report eating a significant amount of starches.  She denies any polyuria, polydipsia, polyphagia, symptomatic hyper or hypoglycemia.  She does not currently have a primary care provider.    Past Medical History:  Diagnosis Date   Diabetes mellitus without complication ([place])     There are no problems to display for this patient.   History reviewed. No pertinent surgical history.  OB History   No obstetric history on file.      Home Medications    Prior to Admission medications   Medication Sig Start Date End Date Taking? Authorizing Provider  acetaminophen (TYLENOL) 325 MG tablet Take 650 mg by mouth every 6 (six) hours as needed for mild pain or headache.     [provider]  insulin aspart (NOVOLOG) 100 UNIT/ML FlexPen Inject 10 Units into the skin daily.    [provider]  insulin glargine (LANTUS) 100 unit/mL SOPN Inject 18 Units into the skin daily.    [provider]    Family History History reviewed. No pertinent family history.  Social History Social History   Tobacco Use   Smoking status: Never   Smokeless tobacco: Never  Substance Use Topics   Alcohol use: Yes    Comment: occasionally   Drug use: No     Allergies   Patient has no known allergies.   Review of Systems Review of Systems  Constitutional:  Positive for activity change. Negative for appetite change, fatigue and fever.  Gastrointestinal:  Negative for abdominal pain, diarrhea, nausea and vomiting.  Endocrine: Negative for polydipsia, polyphagia and polyuria.  Genitourinary:  Negative for dysuria, flank pain, frequency, urgency, vaginal bleeding, vaginal discharge and vaginal pain.     Physical Exam Triage Vital Signs ED Triage Vitals  Enc Vitals Group     BP 06/22/22 1219 (!) 156/96     Pulse Rate 06/22/22 1219 98     Resp 06/22/22 1219 18     Temp 06/22/22 1219 98.6 F (37 C)     Temp Source 06/22/22 1219 Oral     SpO2 06/22/22 1219 97 %     Weight --      Height --      Head Circumference --  Peak Flow --      Pain Score 06/22/22 1220 0     Pain Loc --      Pain Edu? --      Excl. in GC? --    No data found.  Updated Vital Signs BP (!) 156/96 (BP Location: Left Arm)   Pulse 98   Temp 98.6 F (37 C) (Oral)   Resp 18   LMP 06/05/2022 (Exact Date)   SpO2 97%   Visual Acuity Right Eye Distance:   Left Eye Distance:   Bilateral Distance:    Right Eye Near:   Left Eye Near:    Bilateral Near:     Physical Exam Vitals reviewed.  Constitutional:      General: She is awake. She is not in acute distress.    Appearance: Normal appearance. She is well-developed. She is not ill-appearing.     Comments:  Very pleasant female appears stated age in no acute distress sitting comfortably in exam room  HENT:     Head: Normocephalic and atraumatic.  Cardiovascular:     Rate and Rhythm: Normal rate and regular rhythm.     Heart sounds: Normal heart sounds, S1 normal and S2 normal. No murmur heard. Pulmonary:     Effort: Pulmonary effort is normal.     Breath sounds: Normal breath sounds. No wheezing, rhonchi or rales.     Comments: Clear to auscultation bilaterally Abdominal:     General: Bowel sounds are normal.     Palpations: Abdomen is soft.     Tenderness: There is no abdominal tenderness. There is no right CVA tenderness, left CVA tenderness, guarding or rebound.     Comments: Benign abdominal exam  Psychiatric:        Behavior: Behavior is cooperative.      UC Treatments / Results  Labs (all labs ordered are listed, but only abnormal results are displayed) Labs Reviewed  POCT URINALYSIS DIPSTICK, ED / UC - Abnormal; Notable for the following components:      Result Value   Glucose, UA >=1000 (*)    Ketones, ur TRACE (*)    Hgb urine dipstick TRACE (*)    Protein, ur 30 (*)    Nitrite POSITIVE (*)    All other components within normal limits  URINE CULTURE  COMPREHENSIVE METABOLIC PANEL  HEMOGLOBIN A1C  HIV ANTIBODY (ROUTINE TESTING W REFLEX)  RPR  HEPATITIS C ANTIBODY  POC URINE PREG, ED  CBG MONITORING, ED  CERVICOVAGINAL ANCILLARY ONLY    EKG   Radiology No results found.  Procedures Procedures (including critical care time)  Medications Ordered in UC Medications - No data to display  Initial Impression / Assessment and Plan / UC Course  I have reviewed the triage vital signs and the nursing notes.  Pertinent labs & imaging results that were available during my care of the patient were reviewed by me and considered in my medical decision making (see chart for details).     Patient is well-appearing, afebrile, nontoxic, nontachycardic.  She denies any  significant symptoms and so we will defer treatment for STI until results are available.  A complete STI panel obtained today and is pending.  Discussed the importance of abstain from sex until results are available.  Discussed that if she develops any symptoms she should return for reevaluation.  UA was obtained that showed significant glucosuria with trace ketones.  Blood sugar was obtained and was 83.  A1c and CMP were obtained today-results  pending.  Will make recommendations based on laboratory findings and consider restarting metformin and/or long-acting insulin based on lab results.  She was encouraged to push fluids and avoid carbohydrates.  Will try to establish care with a primary care via PCP assistance.  She was given contact information for endocrinology with instruction to call to schedule an appointment.  UA did show positive nitrates so culture was obtained but she denies any current symptoms so we will defer antibiotics until results are available.  Discussed that if she felt any significant symptoms she should return for reevaluation.  Strict return precautions given to which she expressed understanding.  Final Clinical Impressions(s) / UC Diagnoses   Final diagnoses:  Routine screening for STI (sexually transmitted infection)  Type 2 diabetes mellitus without complication, without long-term current use of insulin ([place])  Abnormal urinalysis     Discharge Instructions      Your blood sugar is actually appropriate today.  I have drawn other labs and we will contact you with these labs once I have the results to determine next steps.  Please call and schedule an appointment with endocrinology.  Someone should reach out to you to schedule an appointment with primary care.  If your STI work up is abnormal we will contact you.  Please monitor your MyChart for these results.  Abstain from sex and to receive results.  If you have any additional or changing symptoms please return for  reevaluation.     ED Prescriptions   None    PDMP not reviewed this encounter.   Terrilee Croak, PA-C 06/22/22 1313

## 2022-06-22 NOTE — Discharge Instructions (Signed)
Your blood sugar is actually appropriate today.  I have drawn other labs and we will contact you with these labs once I have the results to determine next steps.  Please call and schedule an appointment with endocrinology.  Someone should reach out to you to schedule an appointment with primary care.  If your STI work up is abnormal we will contact you.  Please monitor your MyChart for these results.  Abstain from sex and to receive results.  If you have any additional or changing symptoms please return for reevaluation.

## 2022-06-23 ENCOUNTER — Telehealth (HOSPITAL_COMMUNITY): Payer: Self-pay | Admitting: Physician Assistant

## 2022-06-23 LAB — SYPHILIS: RPR W/REFLEX TO RPR TITER AND TREPONEMAL ANTIBODIES, TRADITIONAL SCREENING AND DIAGNOSIS ALGORITHM: RPR Ser Ql: NONREACTIVE

## 2022-06-23 MED ORDER — BLOOD GLUCOSE TEST VI STRP: 1.0000 | ORAL_STRIP | Freq: Three times a day (TID) | 1 refills | Status: AC

## 2022-06-23 MED ORDER — LANCET DEVICE MISC: 1.0000 | Freq: Three times a day (TID) | 0 refills | Status: AC

## 2022-06-23 MED ORDER — METFORMIN HCL ER 750 MG PO TB24: ORAL_TABLET | ORAL | 2 refills | Status: DC

## 2022-06-23 MED ORDER — BLOOD GLUCOSE MONITORING SUPPL DEVI: 1.0000 | Freq: Three times a day (TID) | 0 refills | Status: AC

## 2022-06-23 MED ORDER — PEN NEEDLES 30G X 8 MM MISC: 1.0000 | Freq: Every day | 1 refills | Status: AC

## 2022-06-23 MED ORDER — LANCETS MISC. MISC: 1.0000 | Freq: Three times a day (TID) | 1 refills | Status: AC

## 2022-06-23 MED ORDER — INSULIN GLARGINE 100 UNIT/ML SOLOSTAR PEN: 5.0000 [IU] | PEN_INJECTOR | Freq: Every day | SUBCUTANEOUS | 0 refills | Status: DC

## 2022-06-23 NOTE — Telephone Encounter (Signed)
Called and discussed results with patient.  She is open to restarting diabetes medication given A1c of 12.1%.  Medication sent to pharmacy.  See result note for additional information.

## 2022-06-24 LAB — URINE CULTURE
Culture: >=100000 — AB
Special Requests: NORMAL

## 2022-06-25 ENCOUNTER — Ambulatory Visit (HOSPITAL_COMMUNITY)
Admission: EM | Admit: 2022-06-25 | Discharge: 2022-06-25 | Disposition: A | Discharge status: Home / Self Care | Payer: Medicaid Other | Attending: Internal Medicine | Admitting: Internal Medicine

## 2022-06-25 ENCOUNTER — Telehealth (HOSPITAL_COMMUNITY): Payer: Self-pay | Admitting: Emergency Medicine

## 2022-06-25 DIAGNOSIS — A549 Gonococcal infection, unspecified: Secondary | ICD-10-CM

## 2022-06-25 LAB — CERVICOVAGINAL ANCILLARY ONLY
Bacterial Vaginitis (gardnerella): POSITIVE — AB
Candida Glabrata: NEGATIVE
Candida Vaginitis: POSITIVE — AB
Chlamydia: NEGATIVE
Comment: NEGATIVE
Comment: NEGATIVE
Comment: NEGATIVE
Comment: NEGATIVE
Comment: NEGATIVE
Comment: NORMAL
Neisseria Gonorrhea: POSITIVE — AB
Trichomonas: NEGATIVE

## 2022-06-25 MED ORDER — LIDOCAINE HCL (PF) 1 % IJ SOLN
INTRAMUSCULAR | Status: AC
  Filled 2022-06-25: qty 2

## 2022-06-25 MED ORDER — NITROFURANTOIN MONOHYD MACRO 100 MG PO CAPS: 100.0000 mg | ORAL_CAPSULE | Freq: Two times a day (BID) | ORAL | 0 refills | Status: DC

## 2022-06-25 MED ORDER — METRONIDAZOLE 500 MG PO TABS: 500.0000 mg | ORAL_TABLET | Freq: Two times a day (BID) | ORAL | 0 refills | Status: DC

## 2022-06-25 MED ORDER — FLUCONAZOLE 150 MG PO TABS: 150.0000 mg | ORAL_TABLET | Freq: Once | ORAL | 0 refills | Status: AC

## 2022-06-25 MED ORDER — CEFTRIAXONE SODIUM 500 MG IJ SOLR
INTRAMUSCULAR | Status: AC
  Filled 2022-06-25: qty 500

## 2022-06-25 MED ORDER — CEFTRIAXONE SODIUM 1 G IJ SOLR
0.5000 g | Freq: Once | INTRAMUSCULAR | Status: AC
  Administered 2022-06-25: 0.5 g via INTRAMUSCULAR

## 2022-06-25 NOTE — ED Notes (Signed)
Pt received rocephin inj in left ventrogluteal . Pt tolerated inj well

## 2022-06-25 NOTE — ED Triage Notes (Signed)
Pt is here for treatment for gonorrhea  

## 2022-06-25 NOTE — Telephone Encounter (Signed)
Per protocol, patient will need treatment with IM Rocephin 500mg  for positive Gonorrhea. Will also need treatment with Metrogel and Diflucan.  Contacted patient by phone.  Verified identity using two identifiers.  Provided positive result.  Reviewed safe sex practices, notifying partners, and refraining from sexual activities for 7 days from time of treatment.  Patient verified understanding, all questions answered.   Verified pharmacy, prescription sent [facility] notified

## 2022-07-02 ENCOUNTER — Ambulatory Visit (HOSPITAL_COMMUNITY)
Admission: RE | Admit: 2022-07-02 | Discharge: 2022-07-02 | Disposition: A | Discharge status: Home / Self Care | Payer: PRIVATE HEALTH INSURANCE | Source: Ambulatory Visit | Attending: Family Medicine | Admitting: Family Medicine

## 2022-07-02 NOTE — ED Notes (Signed)
Patient in department too early for reevaluation.  Dr Windy Carina notified

## 2022-07-02 NOTE — ED Triage Notes (Signed)
Seen 06/25/2022 was tested positive for gonorrhea.  Patient was treated.    Patient here for recheck.  Patient did not have symptoms at the time and does not have symptoms currently.

## 2022-07-02 NOTE — ED Provider Notes (Signed)
Patient had made appointment for test of cure.  She was treated with Rocephin on January 29, exactly 1 week ago.  I spoke with her in the exam room and discussed with her when test of cure is recommended and for what conditions.  She has had no symptoms.  She has an appointment for primary care in March, so she will discuss with them if she needs any further testing at that point.  Appointment canceled   Barrett Henle, MD 07/02/22 [phone]

## 2022-08-13 ENCOUNTER — Encounter: Payer: Self-pay | Admitting: Internal Medicine

## 2022-08-13 ENCOUNTER — Ambulatory Visit: Payer: PRIVATE HEALTH INSURANCE | Admitting: Internal Medicine

## 2022-08-13 VITALS — BP 140/100 | HR 65 | Temp 98.6°F | Ht 59.0 in | Wt 187.2 lb

## 2022-08-13 DIAGNOSIS — F330 Major depressive disorder, recurrent, mild: Secondary | ICD-10-CM | POA: Diagnosis not present

## 2022-08-13 DIAGNOSIS — E139 Other specified diabetes mellitus without complications: Secondary | ICD-10-CM | POA: Diagnosis not present

## 2022-08-13 DIAGNOSIS — R030 Elevated blood-pressure reading, without diagnosis of hypertension: Secondary | ICD-10-CM | POA: Diagnosis not present

## 2022-08-13 LAB — LIPID PANEL
Cholesterol: 166 mg/dL (ref 0–200)
HDL: 41.9 mg/dL (ref >39.00)
LDL Cholesterol: 104 mg/dL — ABNORMAL HIGH (ref 0–99)
NonHDL: 124.44
Total CHOL/HDL Ratio: 4
Triglycerides: 103 mg/dL (ref 0.0–149.0)
VLDL: 20.6 mg/dL (ref 0.0–40.0)

## 2022-08-13 LAB — MICROALBUMIN / CREATININE URINE RATIO
Creatinine,U: 62.4 mg/dL
Microalb Creat Ratio: 5.7 mg/g (ref 0.0–30.0)
Microalb, Ur: 3.6 mg/dL — ABNORMAL HIGH (ref 0.0–1.9)

## 2022-08-13 LAB — URINALYSIS, ROUTINE W REFLEX MICROSCOPIC
Bilirubin Urine: NEGATIVE
Ketones, ur: 15 — AB
Nitrite: NEGATIVE
Specific Gravity, Urine: 1.02 (ref 1.000–1.030)
Total Protein, Urine: NEGATIVE
Urine Glucose: >=1000 — AB
Urobilinogen, UA: 0.2 (ref 0.0–1.0)
pH: 6 (ref 5.0–8.0)

## 2022-08-13 NOTE — Patient Instructions (Addendum)
We will check the labs today and the urine.  We will see you back in 3-6 months

## 2022-08-13 NOTE — Progress Notes (Unsigned)
   Subjective:   Patient ID: Tanya Torres, female    DOB: Apr 02, 1996, 27 y.o.   MRN: [id number]  HPI The patient is a new 27 YO female coming in for needing health care no pcp in some time.  Review of Systems  Objective:  Physical Exam  Vitals:   08/13/22 0939 08/13/22 0940  BP: (!) 140/100 (!) 140/100  Pulse: 65   Temp: 98.6 F (37 C)   TempSrc: Oral   Weight: 187 lb 4 oz (84.9 kg)   Height: 4\' 11"  (1.499 m)     Assessment & Plan:

## 2022-08-14 DIAGNOSIS — F329 Major depressive disorder, single episode, unspecified: Secondary | ICD-10-CM | POA: Insufficient documentation

## 2022-08-14 NOTE — Assessment & Plan Note (Signed)
BP is not normally elevated per patient. She has checked it randomly. She does have strong family history of hypertension and renal failure (mom and dad both with kidney failure) so we will need to have close monitoring.

## 2022-08-14 NOTE — Assessment & Plan Note (Signed)
Taking metformin 1500 mg daily xr. Started about 2 months ago and checking fructosamine today. Checking lipid panel as none recently. Is not on ACE-I/ARB or statin. Checking microalbumin to creatinine ratio. Referral done to eye doctor for eye exam to screen for retinopathy.

## 2022-08-14 NOTE — Assessment & Plan Note (Signed)
Referral to counseling is having some depression related to health and social situation. She is not requiring medication. Discussed coping skills during visit.

## 2022-08-14 NOTE — Assessment & Plan Note (Signed)
BMI 37 and complicated by diabetes and depression and high BP. Counseled on diet and exercise and taking metformin to help with weight loss.

## 2022-08-15 ENCOUNTER — Telehealth: Payer: Self-pay | Admitting: Internal Medicine

## 2022-08-15 NOTE — Telephone Encounter (Signed)
Pt want a call back so the nurse can go over lab results with her.  Best call back # [phone]

## 2022-08-15 NOTE — Telephone Encounter (Signed)
Called pt she states was able to see the labs on mychart but wasn't no results from MD. Inform pt will send MD msg want results on UA ..lmb

## 2022-08-16 LAB — GC/CHLAMYDIA PROBE AMP
Chlamydia trachomatis, NAA: NEGATIVE
Neisseria Gonorrhoeae by PCR: NEGATIVE

## 2022-08-16 LAB — FRUCTOSAMINE: Fructosamine: 448 umol/L — ABNORMAL HIGH (ref 205–285)

## 2022-08-21 ENCOUNTER — Telehealth: Payer: PRIVATE HEALTH INSURANCE | Admitting: Nurse Practitioner

## 2022-08-21 DIAGNOSIS — N898 Other specified noninflammatory disorders of vagina: Secondary | ICD-10-CM | POA: Diagnosis not present

## 2022-08-21 MED ORDER — FLUCONAZOLE 150 MG PO TABS: 150.0000 mg | ORAL_TABLET | Freq: Once | ORAL | 0 refills | Status: AC

## 2022-08-21 NOTE — Progress Notes (Signed)

## 2022-09-17 ENCOUNTER — Encounter: Payer: Self-pay | Admitting: Internal Medicine

## 2022-09-17 ENCOUNTER — Telehealth: Payer: PRIVATE HEALTH INSURANCE | Admitting: Nurse Practitioner

## 2022-09-17 DIAGNOSIS — B3731 Acute candidiasis of vulva and vagina: Secondary | ICD-10-CM

## 2022-09-17 MED ORDER — FLUCONAZOLE 150 MG PO TABS: ORAL_TABLET | ORAL | 0 refills | Status: DC

## 2022-09-17 NOTE — Progress Notes (Signed)
Virtual Visit Consent   Tanya Torres, you are scheduled for a virtual visit with a [facility] provider today. Just as with appointments in the office, your consent must be obtained to participate. Your consent will be active for this visit and any virtual visit you may have with one of our providers in the next 365 days. If you have a MyChart account, a copy of this consent can be sent to you electronically.  As this is a virtual visit, video technology does not allow for your provider to perform a traditional examination. This may limit your provider's ability to fully assess your condition. If your provider identifies any concerns that need to be evaluated in person or the need to arrange testing (such as labs, EKG, etc.), we will make arrangements to do so. Although advances in technology are sophisticated, we cannot ensure that it will always work on either your end or our end. If the connection with a video visit is poor, the visit may have to be switched to a telephone visit. With either a video or telephone visit, we are not always able to ensure that we have a secure connection.  By engaging in this virtual visit, you consent to the provision of healthcare and authorize for your insurance to be billed (if applicable) for the services provided during this visit. Depending on your insurance coverage, you may receive a charge related to this service.  I need to obtain your verbal consent now. Are you willing to proceed with your visit today? Tanya Torres has provided verbal consent on 09/17/2022 for a virtual visit (video or telephone). Viviano Simas, FNP  Date: 09/17/2022 9:37 AM  Virtual Visit via Video Note   I, Viviano Simas, connected with  Tanya Torres  ([id number], Nov 21, 1995) on 09/17/22 at  9:45 AM EDT by a video-enabled telemedicine application and verified that I am speaking with the correct person using two identifiers.  Location: Patient: Virtual Visit Location Patient:  Home Provider: Virtual Visit Location Provider: Home Office   I discussed the limitations of evaluation and management by telemedicine and the availability of in person appointments. The patient expressed understanding and agreed to proceed.    History of Present Illness: Tanya Torres is a 27 y.o. who identifies as a female who was assigned female at birth, and is being seen today for recurrent symptoms of vaginal discharge.  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 May substitute to any manufacturer covered by patient's insurance., Disp: 1 each, Rfl: 0   insulin glargine (LANTUS) 100 UNIT/ML Solostar Pen, Inject 5 Units into the skin at bedtime., Disp: 15 mL, Rfl: 0   Insulin Pen Needle (PEN NEEDLES)  30G X 8 MM MISC, 1 Needle by Does not apply route daily., Disp: 100 each, Rfl: 1   metFORMIN (GLUCOPHAGE-XR) 750 MG 24 hr tablet, Take 1 tablet with breakfast for 1 week.  Then add second tablet with dinner after 1 week for final dosage of 1 tablet twice daily., Disp: 60 tablet, Rfl: 2  Observations/Objective: Patient is well-developed, well-nourished in no acute distress.  Resting comfortably  at home.  Head is normocephalic, atraumatic.  No  labored breathing.  Speech is clear and coherent with logical content.  Patient is alert and oriented at baseline.   Assessment:  1. Vaginal yeast infection     Meds ordered this encounter  Medications   fluconazole (DIFLUCAN) 150 MG tablet    Sig: Take one pill and repeat after 72 hours x1 as needed    Dispense:  2 tablet    Refill:  0     Follow Up Instructions: I discussed the assessment and treatment plan with the patient. The patient was provided an opportunity to ask questions and all were answered. The patient agreed with the plan and demonstrated an understanding of the instructions.  A copy of instructions were sent to the patient via MyChart unless otherwise noted below.    The patient was advised to call back or seek an in-person evaluation if the symptoms worsen or if the condition fails to improve as anticipated.  Time:  I spent 10 minutes with the patient via telehealth technology discussing the above problems/concerns.    Viviano Simas, FNP

## 2022-10-12 ENCOUNTER — Encounter: Payer: Self-pay | Admitting: Internal Medicine

## 2022-10-12 ENCOUNTER — Ambulatory Visit: Payer: PRIVATE HEALTH INSURANCE | Admitting: Internal Medicine

## 2022-10-12 ENCOUNTER — Other Ambulatory Visit (HOSPITAL_COMMUNITY)
Admission: RE | Admit: 2022-10-12 | Discharge: 2022-10-12 | Disposition: A | Discharge status: Home / Self Care | Payer: PRIVATE HEALTH INSURANCE | Source: Ambulatory Visit | Attending: Internal Medicine | Admitting: Internal Medicine

## 2022-10-12 VITALS — BP 140/80 | HR 99 | Temp 98.2°F | Ht 59.0 in | Wt 177.0 lb

## 2022-10-12 DIAGNOSIS — Z113 Encounter for screening for infections with a predominantly sexual mode of transmission: Secondary | ICD-10-CM

## 2022-10-12 DIAGNOSIS — E139 Other specified diabetes mellitus without complications: Secondary | ICD-10-CM | POA: Diagnosis not present

## 2022-10-12 LAB — CBC
HCT: 41.7 % (ref 36.0–46.0)
Hemoglobin: 13.5 g/dL (ref 12.0–15.0)
MCHC: 32.4 g/dL (ref 30.0–36.0)
MCV: 80.1 fl (ref 78.0–100.0)
Platelets: 307 10*3/uL (ref 150.0–400.0)
RBC: 5.2 Mil/uL — ABNORMAL HIGH (ref 3.87–5.11)
RDW: 16 % — ABNORMAL HIGH (ref 11.5–15.5)
WBC: 9.6 10*3/uL (ref 4.0–10.5)

## 2022-10-12 LAB — COMPREHENSIVE METABOLIC PANEL
ALT: 11 U/L (ref 0–35)
AST: 11 U/L (ref 0–37)
Albumin: 4.3 g/dL (ref 3.5–5.2)
Alkaline Phosphatase: 56 U/L (ref 39–117)
BUN: 11 mg/dL (ref 6–23)
CO2: 30 mEq/L (ref 19–32)
Calcium: 9.8 mg/dL (ref 8.4–10.5)
Chloride: 99 mEq/L (ref 96–112)
Creatinine, Ser: 0.69 mg/dL (ref 0.40–1.20)
GFR: 118.61 mL/min (ref >60.00)
Glucose, Bld: 307 mg/dL — ABNORMAL HIGH (ref 70–99)
Potassium: 3.7 mEq/L (ref 3.5–5.1)
Sodium: 136 mEq/L (ref 135–145)
Total Bilirubin: 0.3 mg/dL (ref 0.2–1.2)
Total Protein: 7.4 g/dL (ref 6.0–8.3)

## 2022-10-12 LAB — LIPID PANEL
Cholesterol: 166 mg/dL (ref 0–200)
HDL: 39.8 mg/dL (ref >39.00)
LDL Cholesterol: 101 mg/dL — ABNORMAL HIGH (ref 0–99)
NonHDL: 126.48
Total CHOL/HDL Ratio: 4
Triglycerides: 127 mg/dL (ref 0.0–149.0)
VLDL: 25.4 mg/dL (ref 0.0–40.0)

## 2022-10-12 LAB — HEMOGLOBIN A1C: Hgb A1c MFr Bld: 13.8 % — ABNORMAL HIGH (ref 4.6–6.5)

## 2022-10-12 MED ORDER — METFORMIN HCL ER 750 MG PO TB24: 1500.0000 mg | ORAL_TABLET | Freq: Every day | ORAL | 3 refills | Status: DC

## 2022-10-12 NOTE — Assessment & Plan Note (Signed)
Screening done for yeast, BV, GC/chalmydia, bHCG, RPR, HIV. Treat as appropriate.

## 2022-10-12 NOTE — Progress Notes (Signed)
   Subjective:   Patient ID: Tanya Torres, female    DOB: 1995-08-24, 27 y.o.   MRN: [id number]  HPI The patient is a 27 YO female coming in for follow up diabetes wants STI screening. Had made dietary changes and lost about 10 pounds since last visit 2 months ago. Is exercising last 3 weeks.  Review of Systems  Constitutional: Negative.   HENT: Negative.    Eyes: Negative.   Respiratory:  Negative for cough, chest tightness and shortness of breath.   Cardiovascular:  Negative for chest pain, palpitations and leg swelling.  Gastrointestinal:  Negative for abdominal distention, abdominal pain, constipation, diarrhea, nausea and vomiting.  Musculoskeletal: Negative.   Skin: Negative.   Neurological: Negative.   Psychiatric/Behavioral: Negative.      Objective:  Physical Exam Constitutional:      Appearance: She is well-developed.  HENT:     Head: Normocephalic and atraumatic.  Cardiovascular:     Rate and Rhythm: Normal rate and regular rhythm.  Pulmonary:     Effort: Pulmonary effort is normal. No respiratory distress.     Breath sounds: Normal breath sounds. No wheezing or rales.  Abdominal:     General: Bowel sounds are normal. There is no distension.     Palpations: Abdomen is soft.     Tenderness: There is no abdominal tenderness. There is no rebound.  Musculoskeletal:     Cervical back: Normal range of motion.  Skin:    General: Skin is warm and dry.  Neurological:     Mental Status: She is alert and oriented to person, place, and time.     Coordination: Coordination normal.     Vitals:   10/12/22 0942 10/12/22 0944  BP: (!) 140/80 (!) 140/80  Pulse: 99   Temp: 98.2 F (36.8 C)   TempSrc: Oral   SpO2: 95%   Weight: 177 lb (80.3 kg)   Height: 4\' 11"  (1.499 m)     Assessment & Plan:

## 2022-10-12 NOTE — Assessment & Plan Note (Signed)
Checking HgA1c and adjust as needed as well as lipid panel. Taking lantus 5 units daily and metformin 1500 mg daily. Adjust as needed.

## 2022-10-13 LAB — RPR: RPR Ser Ql: NONREACTIVE

## 2022-10-13 LAB — HCG, SERUM, QUALITATIVE: Preg, Serum: NEGATIVE

## 2022-10-13 LAB — HIV ANTIBODY (ROUTINE TESTING W REFLEX): HIV 1&2 Ab, 4th Generation: NONREACTIVE

## 2022-10-15 ENCOUNTER — Telehealth: Payer: Self-pay | Admitting: Internal Medicine

## 2022-10-15 NOTE — Addendum Note (Signed)
Addended by: Myrlene Broker on: 10/15/2022 09:13 AM   Modules accepted: Orders

## 2022-10-15 NOTE — Telephone Encounter (Signed)
Pt called wanting a call back from nurse to go over lab results.

## 2022-10-15 NOTE — Telephone Encounter (Signed)
LVM for patient

## 2022-10-16 LAB — URINE CYTOLOGY ANCILLARY ONLY
Bacterial Vaginitis-Urine: NEGATIVE
Candida Urine: NEGATIVE — AB
Candida Urine: POSITIVE — AB
Chlamydia: NEGATIVE
Comment: NEGATIVE
Comment: NEGATIVE
Comment: NORMAL
Neisseria Gonorrhea: NEGATIVE
Trichomonas: NEGATIVE

## 2022-10-17 ENCOUNTER — Other Ambulatory Visit: Payer: Self-pay | Admitting: Internal Medicine

## 2022-10-17 MED ORDER — FLUCONAZOLE 150 MG PO TABS: 150.0000 mg | ORAL_TABLET | Freq: Once | ORAL | 0 refills | Status: AC

## 2022-12-06 ENCOUNTER — Telehealth: Payer: PRIVATE HEALTH INSURANCE | Admitting: Physician Assistant

## 2022-12-06 DIAGNOSIS — B3731 Acute candidiasis of vulva and vagina: Secondary | ICD-10-CM | POA: Diagnosis not present

## 2022-12-06 MED ORDER — FLUCONAZOLE 150 MG PO TABS
150.0000 mg | ORAL_TABLET | ORAL | 0 refills | Status: DC | PRN
Start: 2022-12-06 — End: 2022-12-29

## 2022-12-06 NOTE — Progress Notes (Signed)

## 2022-12-06 NOTE — Addendum Note (Signed)
Addended by: Margaretann Loveless on: 12/06/2022 11:31 AM   Modules accepted: Level of Service

## 2022-12-29 ENCOUNTER — Telehealth: Payer: PRIVATE HEALTH INSURANCE | Admitting: Family Medicine

## 2022-12-29 DIAGNOSIS — B3731 Acute candidiasis of vulva and vagina: Secondary | ICD-10-CM

## 2022-12-29 MED ORDER — FLUCONAZOLE 150 MG PO TABS
150.0000 mg | ORAL_TABLET | Freq: Once | ORAL | 0 refills | Status: AC
Start: 2022-12-29 — End: 2022-12-29

## 2022-12-29 NOTE — Progress Notes (Signed)

## 2023-02-02 ENCOUNTER — Telehealth: Payer: PRIVATE HEALTH INSURANCE | Admitting: Family

## 2023-02-02 DIAGNOSIS — N898 Other specified noninflammatory disorders of vagina: Secondary | ICD-10-CM

## 2023-02-02 DIAGNOSIS — R300 Dysuria: Secondary | ICD-10-CM

## 2023-02-02 NOTE — Progress Notes (Signed)
Because you continue to have recurrent yeast infections and possible UTI, I feel your condition warrants further evaluation and I recommend that you be seen in a face to face visit for further testing.    NOTE: There will be NO CHARGE for this eVisit   If you are having a true medical emergency please call 911.      For an urgent face to face visit, [facility] has eight urgent care centers for your convenience:   NEW!! [facility] Center at [facility] Get Driving Directions [phone] [address], [address] [place], [address]    [facility] at [facility] Get Driving Directions [phone] [address] [place], [place] [address]   [facility] Care Center [facility]) Get Driving Directions [phone] [address] [place], [place] [address]  [facility] Health Urgent Care Center [facility]) Get Driving Directions [phone] [address] [place],  [place]  [address]  [facility] Health Urgent Care Center [facility] - at [employer]  [phone] [address] [place],  [place] [address]   [facility] Health Urgent Care at [facility] Get Driving Directions [phone] [address], [address] [place], [place] [address]   [facility] Health Urgent Care at [facility] Get Driving Directions  [phone] [address] [place], [place] [address]   [facility] Health Urgent Care at [facility] Directions [phone] [address], [address] [place], [place] [address]  Your MyChart E-visit questionnaire answers were reviewed by a board certified advanced clinical practitioner to complete your personal care plan based on your specific symptoms.  Thank you for using e-Visits.

## 2023-02-20 ENCOUNTER — Ambulatory Visit: Payer: PRIVATE HEALTH INSURANCE | Admitting: Internal Medicine

## 2023-02-20 ENCOUNTER — Encounter: Payer: Self-pay | Admitting: Internal Medicine

## 2023-02-20 VITALS — BP 140/92 | HR 89 | Temp 98.6°F | Ht 59.0 in | Wt 175.0 lb

## 2023-02-20 DIAGNOSIS — B379 Candidiasis, unspecified: Secondary | ICD-10-CM

## 2023-02-20 DIAGNOSIS — E139 Other specified diabetes mellitus without complications: Secondary | ICD-10-CM

## 2023-02-20 DIAGNOSIS — Z3141 Encounter for fertility testing: Secondary | ICD-10-CM

## 2023-02-20 DIAGNOSIS — R300 Dysuria: Secondary | ICD-10-CM

## 2023-02-20 DIAGNOSIS — Z113 Encounter for screening for infections with a predominantly sexual mode of transmission: Secondary | ICD-10-CM

## 2023-02-20 DIAGNOSIS — R030 Elevated blood-pressure reading, without diagnosis of hypertension: Secondary | ICD-10-CM

## 2023-02-20 LAB — POCT URINALYSIS DIPSTICK
Bilirubin, UA: NEGATIVE
Blood, UA: NEGATIVE
Glucose, UA: POSITIVE — AB
Ketones, UA: NEGATIVE
Nitrite, UA: POSITIVE
Protein, UA: NEGATIVE
Spec Grav, UA: 1.025 (ref 1.010–1.025)
Urobilinogen, UA: NEGATIVE E.U./dL — AB
pH, UA: 5 (ref 5.0–8.0)

## 2023-02-20 LAB — POCT GLYCOSYLATED HEMOGLOBIN (HGB A1C): HbA1c POC (<> result, manual entry): 12.5 % (ref 4.0–5.6)

## 2023-02-20 LAB — COMPREHENSIVE METABOLIC PANEL
ALT: 8 U/L (ref 0–35)
AST: 11 U/L (ref 0–37)
Albumin: 4.1 g/dL (ref 3.5–5.2)
Alkaline Phosphatase: 60 U/L (ref 39–117)
BUN: 10 mg/dL (ref 6–23)
CO2: 28 mEq/L (ref 19–32)
Calcium: 9.3 mg/dL (ref 8.4–10.5)
Chloride: 100 mEq/L (ref 96–112)
Creatinine, Ser: 0.67 mg/dL (ref 0.40–1.20)
GFR: 119.16 mL/min (ref >60.00)
Glucose, Bld: 266 mg/dL — ABNORMAL HIGH (ref 70–99)
Potassium: 3.8 mEq/L (ref 3.5–5.1)
Sodium: 136 mEq/L (ref 135–145)
Total Bilirubin: 0.6 mg/dL (ref 0.2–1.2)
Total Protein: 6.9 g/dL (ref 6.0–8.3)

## 2023-02-20 LAB — CBC
HCT: 39.8 % (ref 36.0–46.0)
Hemoglobin: 12.5 g/dL (ref 12.0–15.0)
MCHC: 31.3 g/dL (ref 30.0–36.0)
MCV: 80.2 fl (ref 78.0–100.0)
Platelets: 312 10*3/uL (ref 150.0–400.0)
RBC: 4.97 Mil/uL (ref 3.87–5.11)
RDW: 14.6 % (ref 11.5–15.5)
WBC: 9.9 10*3/uL (ref 4.0–10.5)

## 2023-02-20 LAB — LIPID PANEL
Cholesterol: 151 mg/dL (ref 0–200)
HDL: 39.1 mg/dL (ref >39.00)
LDL Cholesterol: 94 mg/dL (ref 0–99)
NonHDL: 111.45
Total CHOL/HDL Ratio: 4
Triglycerides: 89 mg/dL (ref 0.0–149.0)
VLDL: 17.8 mg/dL (ref 0.0–40.0)

## 2023-02-20 MED ORDER — FLUCONAZOLE 150 MG PO TABS: ORAL_TABLET | ORAL | 1 refills | Status: DC

## 2023-02-20 MED ORDER — CEPHALEXIN 500 MG PO CAPS: 500.0000 mg | ORAL_CAPSULE | Freq: Two times a day (BID) | ORAL | 0 refills | Status: AC

## 2023-02-20 MED ORDER — METFORMIN HCL ER 750 MG PO TB24: 2250.0000 mg | ORAL_TABLET | Freq: Every day | ORAL | 3 refills | Status: DC

## 2023-02-20 MED ORDER — INSULIN GLARGINE 100 UNIT/ML SOLOSTAR PEN: 5.0000 [IU] | PEN_INJECTOR | Freq: Every day | SUBCUTANEOUS | 3 refills | Status: AC

## 2023-02-20 NOTE — Assessment & Plan Note (Signed)
POC HgA1c checked and severely uncontrolled. She is improved slightly. Increase metformin to 3 pills (2250 mg) daily xr and increase lantus based on morning sugars. She will increase to 7 units now and then every 3 days if morning sugar >150 increase 2 units and repeat every 3 days until morning sugar consistently 100-150 or at 25 units daily. Come back 2-3 months. We discussed that if she is trying to get pregnant this does limit our diabetes options for medications as several are not indicated in that situation.

## 2023-02-20 NOTE — Progress Notes (Signed)
Subjective:   Patient ID: Tanya Torres, female    DOB: Jun 23, 1995, 27 y.o.   MRN: [id number]  HPI The patient is a 27 YO female coming in recurrent yeast infection and uti and was told it was related to diabetes. She is taking lantus 5 units daily and metformin 1500 mg xr daily. Would like to talk to fertility doctor as she is struggling to get pregnant and very stressed and has been told she has pcos. Could not find an ob/gyn to take her insurance.   Review of Systems  Constitutional: Negative.   HENT: Negative.    Eyes: Negative.   Respiratory: Negative.  Negative for cough, chest tightness and shortness of breath.   Cardiovascular: Negative.  Negative for chest pain, palpitations and leg swelling.  Gastrointestinal:  Negative for abdominal distention, abdominal pain, constipation, diarrhea, nausea and vomiting.  Genitourinary:  Positive for dysuria, frequency and urgency.  Musculoskeletal: Negative.   Skin: Negative.   Neurological: Negative.   Psychiatric/Behavioral: Negative.      Objective:  Physical Exam Constitutional:      Appearance: She is well-developed.  HENT:     Head: Normocephalic and atraumatic.  Cardiovascular:     Rate and Rhythm: Normal rate and regular rhythm.  Pulmonary:     Effort: Pulmonary effort is normal. No respiratory distress.     Breath sounds: Normal breath sounds. No wheezing or rales.  Abdominal:     General: Bowel sounds are normal. There is no distension.     Palpations: Abdomen is soft.     Tenderness: There is no abdominal tenderness. There is no rebound.  Musculoskeletal:     Cervical back: Normal range of motion.  Skin:    General: Skin is warm and dry.  Neurological:     Mental Status: She is alert and oriented to person, place, and time.     Coordination: Coordination normal.     Vitals:   02/20/23 0901 02/20/23 0903  BP: (!) 140/100 (!) 140/100  Pulse: 89   Temp: 98.6 F (37 C)   TempSrc: Oral   SpO2: 98%   Weight:  175 lb (79.4 kg)   Height: 4\' 11"  (1.499 m)     Assessment & Plan:

## 2023-02-20 NOTE — Assessment & Plan Note (Signed)
Needs referral to ob/gyn for routine care and pre-conception counseling. It is not an ideal time for pregnancy with high BP and uncontrolled diabetes and counseled that it is very important to get her health under control before pregnancy.

## 2023-02-20 NOTE — Assessment & Plan Note (Signed)
Wants full screening which is done today including pregnancy, RPR, HIV, GC/chlamydia. U/A done for UTI symptoms consistent with infection treating with keflex 1 week due to uncontrolled diabetes.

## 2023-02-20 NOTE — Patient Instructions (Addendum)
We will increase the metformin to 3 pills daily. Increase the insulin to 7 units and every 3 times you check the morning sugars and if it is still above 150 go up 2 units. Keep increasing every 3 times if elevated until your morning sugar is consistently 100-150 or you get to 25 units.   We have sent in keflex to take 1 pill twice a day for 1 week for infection in urine.  We have sent in diflucan to take 1 pill every 3 days for 2 weeks, then 1 pill weekly for another month.

## 2023-02-20 NOTE — Assessment & Plan Note (Signed)
High today and she is under a lot of stress. We decided to work on diabetes control and if persistently high at next visit would start medication (compatible with pregnancy since she is trying to conceive).

## 2023-02-20 NOTE — Assessment & Plan Note (Signed)
U/A done and consistent with infection. Rx keflex 1 week extended treatment due to poorly controlled diabetes.

## 2023-02-20 NOTE — Assessment & Plan Note (Signed)
Given uncontrolled diabetes she will require extended treatment and needs to get diabetes under control. Rx diflucan 150 mg every 72 hours for 2 weeks, then weekly for 1 month.

## 2023-02-21 LAB — HIV ANTIBODY (ROUTINE TESTING W REFLEX): HIV 1&2 Ab, 4th Generation: NONREACTIVE

## 2023-02-21 LAB — RPR: RPR Ser Ql: NONREACTIVE

## 2023-02-21 LAB — PREGNANCY, URINE: Preg Test, Ur: NEGATIVE

## 2023-02-25 LAB — GC/CHLAMYDIA PROBE AMP
Chlamydia trachomatis, NAA: NEGATIVE
Neisseria Gonorrhoeae by PCR: NEGATIVE

## 2023-02-28 ENCOUNTER — Telehealth: Payer: Self-pay | Admitting: Internal Medicine

## 2023-02-28 ENCOUNTER — Other Ambulatory Visit: Payer: Self-pay | Admitting: Family

## 2023-02-28 NOTE — Addendum Note (Signed)
Addended by: Jannifer Rodney A on: 02/28/2023 12:33 PM   Modules accepted: Level of Service

## 2023-02-28 NOTE — Telephone Encounter (Signed)
Pt called wanting all her lab results sent over to her mychart from 09/25. Please advise

## 2023-02-28 NOTE — Telephone Encounter (Signed)
All results are in her my chart. Not sure if there is something specific she is requesting

## 2023-03-13 ENCOUNTER — Telehealth: Payer: Self-pay | Admitting: Internal Medicine

## 2023-03-13 DIAGNOSIS — Z3A01 Less than 8 weeks gestation of pregnancy: Secondary | ICD-10-CM

## 2023-03-13 NOTE — Telephone Encounter (Signed)
Pt  been trying to get in with the OBGYN  that she was referred to back in September. Pt haven't heard anything and they will not answer the phone pt is concern because now she is currently pregnant and being a type 2 diabetic. Pt stated if she can't get in touch with them she would be happy to go to another OBGYN. Please advise.

## 2023-03-13 NOTE — Telephone Encounter (Signed)
Have placed a new referral but technically she does not need a referral she can call any ob/gyn in her network she wants to see.

## 2023-03-13 NOTE — Telephone Encounter (Signed)
Called pt and explained to her about MD statement and she verbalized she has her conformation appointment this Friday

## 2023-03-15 ENCOUNTER — Ambulatory Visit (INDEPENDENT_AMBULATORY_CARE_PROVIDER_SITE_OTHER): Payer: PRIVATE HEALTH INSURANCE

## 2023-03-15 VITALS — BP 139/94 | HR 94 | Wt 172.6 lb

## 2023-03-15 DIAGNOSIS — Z3201 Encounter for pregnancy test, result positive: Secondary | ICD-10-CM | POA: Diagnosis not present

## 2023-03-15 DIAGNOSIS — Z3200 Encounter for pregnancy test, result unknown: Secondary | ICD-10-CM

## 2023-03-15 LAB — POCT URINE PREGNANCY: Preg Test, Ur: POSITIVE — AB

## 2023-03-15 MED ORDER — PRENATAL GUMMIES/DHA & FA 0.4-32.5 MG PO CHEW: 3.0000 | CHEWABLE_TABLET | Freq: Every day | ORAL | 11 refills | Status: DC

## 2023-03-15 NOTE — Progress Notes (Signed)
..  Ms. Erps presents today for UPT. She has no unusual complaints. LMP: 02/06/23 Pt states that her BP is elevated today because she is nervous and this is typical for her during doctor's appointments, denies abnormal symptoms.    OBJECTIVE: Appears well, in no apparent distress.  OB History     Gravida  2   Para      Term      Preterm      AB  1   Living         SAB  1   IAB      Ectopic      Multiple      Live Births             Home UPT Result: Positive In-Office UPT result: Positive I have reviewed the patient's medical, obstetrical, social, and family histories, and medications.   ASSESSMENT: Positive pregnancy test  PLAN Prenatal care to be completed at: [facility] sent to pharmacy Provided safe med list Provided pt with BP cuff to take home and monitor BP, advised to notify if BP continues to be elevated or symptoms occur, notified in office provider.

## 2023-03-18 ENCOUNTER — Inpatient Hospital Stay (HOSPITAL_COMMUNITY)
Admission: AD | Admit: 2023-03-18 | Discharge: 2023-03-19 | Disposition: A | Discharge status: Home / Self Care | Payer: PRIVATE HEALTH INSURANCE | Attending: Obstetrics and Gynecology | Admitting: Obstetrics and Gynecology

## 2023-03-18 ENCOUNTER — Inpatient Hospital Stay (HOSPITAL_COMMUNITY): Payer: PRIVATE HEALTH INSURANCE

## 2023-03-18 ENCOUNTER — Encounter (HOSPITAL_COMMUNITY): Payer: Self-pay | Admitting: Obstetrics and Gynecology

## 2023-03-18 DIAGNOSIS — R109 Unspecified abdominal pain: Secondary | ICD-10-CM | POA: Diagnosis not present

## 2023-03-18 DIAGNOSIS — O26891 Other specified pregnancy related conditions, first trimester: Secondary | ICD-10-CM | POA: Insufficient documentation

## 2023-03-18 DIAGNOSIS — N939 Abnormal uterine and vaginal bleeding, unspecified: Secondary | ICD-10-CM | POA: Diagnosis not present

## 2023-03-18 DIAGNOSIS — O209 Hemorrhage in early pregnancy, unspecified: Secondary | ICD-10-CM | POA: Insufficient documentation

## 2023-03-18 DIAGNOSIS — Z3A01 Less than 8 weeks gestation of pregnancy: Secondary | ICD-10-CM | POA: Insufficient documentation

## 2023-03-18 DIAGNOSIS — O3680X0 Pregnancy with inconclusive fetal viability, not applicable or unspecified: Secondary | ICD-10-CM | POA: Diagnosis not present

## 2023-03-18 LAB — COMPREHENSIVE METABOLIC PANEL
ALT: 20 U/L (ref 0–44)
AST: 16 U/L (ref 15–41)
Albumin: 3.5 g/dL (ref 3.5–5.0)
Alkaline Phosphatase: 50 U/L (ref 38–126)
Anion gap: 12 (ref 5–15)
BUN: 9 mg/dL (ref 6–20)
CO2: 22 mmol/L (ref 22–32)
Calcium: 9 mg/dL (ref 8.9–10.3)
Chloride: 103 mmol/L (ref 98–111)
Creatinine, Ser: 0.62 mg/dL (ref 0.44–1.00)
GFR, Estimated: >60 mL/min (ref >60)
Glucose, Bld: 223 mg/dL — ABNORMAL HIGH (ref 70–99)
Potassium: 3.3 mmol/L — ABNORMAL LOW (ref 3.5–5.1)
Sodium: 137 mmol/L (ref 135–145)
Total Bilirubin: 0.4 mg/dL (ref 0.3–1.2)
Total Protein: 6.6 g/dL (ref 6.5–8.1)

## 2023-03-18 LAB — URINALYSIS, ROUTINE W REFLEX MICROSCOPIC
Bilirubin Urine: NEGATIVE
Glucose, UA: >=500 mg/dL — AB
Ketones, ur: 20 mg/dL — AB
Nitrite: NEGATIVE
Protein, ur: 100 mg/dL — AB
Specific Gravity, Urine: 1.022 (ref 1.005–1.030)
pH: 5 (ref 5.0–8.0)

## 2023-03-18 LAB — HCG, QUANTITATIVE, PREGNANCY: hCG, Beta Chain, Quant, S: 504 m[IU]/mL — ABNORMAL HIGH (ref <5)

## 2023-03-18 LAB — ABO/RH: ABO/RH(D): A POS

## 2023-03-18 LAB — CBC
HCT: 37.9 % (ref 36.0–46.0)
Hemoglobin: 12.3 g/dL (ref 12.0–15.0)
MCH: 25.4 pg — ABNORMAL LOW (ref 26.0–34.0)
MCHC: 32.5 g/dL (ref 30.0–36.0)
MCV: 78.3 fL — ABNORMAL LOW (ref 80.0–100.0)
Platelets: 303 10*3/uL (ref 150–400)
RBC: 4.84 MIL/uL (ref 3.87–5.11)
RDW: 13.9 % (ref 11.5–15.5)
WBC: 13.8 10*3/uL — ABNORMAL HIGH (ref 4.0–10.5)
nRBC: 0 % (ref 0.0–0.2)

## 2023-03-18 NOTE — MAU Note (Signed)
Called radiology reading room [place], he stated he will sent the [place] directly to radiologist and should be read in 20 minutes.

## 2023-03-18 NOTE — MAU Note (Signed)
..  Tanya Torres is a 27 y.o. at [redacted]w[redacted]d here in MAU reporting: abominal cramping that began today and got worse at 1830. Has not taken anything for the pain. Reports vaginal bleeding began when she got here, the blood is dark red.  Patient is tearful in triage.  Pain score: 10/10 Vitals:   03/18/23 2159  BP: (!) 149/91  Pulse: (!) 106  Resp: 16  Temp: 97.9 F (36.6 C)  SpO2: 100%     FHT:n/a

## 2023-03-19 DIAGNOSIS — N939 Abnormal uterine and vaginal bleeding, unspecified: Secondary | ICD-10-CM | POA: Diagnosis not present

## 2023-03-19 DIAGNOSIS — Z3A01 Less than 8 weeks gestation of pregnancy: Secondary | ICD-10-CM | POA: Diagnosis not present

## 2023-03-19 MED ORDER — ACETAMINOPHEN 500 MG PO TABS
1000.0000 mg | ORAL_TABLET | Freq: Once | ORAL | Status: AC
  Administered 2023-03-19: 1000 mg via ORAL
  Filled 2023-03-19: qty 2

## 2023-03-19 MED ORDER — CYCLOBENZAPRINE HCL 5 MG PO TABS
5.0000 mg | ORAL_TABLET | Freq: Three times a day (TID) | ORAL | Status: DC | PRN
  Administered 2023-03-19: 5 mg via ORAL
  Filled 2023-03-19: qty 1

## 2023-03-21 ENCOUNTER — Other Ambulatory Visit: Payer: PRIVATE HEALTH INSURANCE

## 2023-05-01 ENCOUNTER — Encounter: Payer: PRIVATE HEALTH INSURANCE | Admitting: Obstetrics and Gynecology

## 2023-06-01 ENCOUNTER — Other Ambulatory Visit: Payer: Self-pay | Admitting: Internal Medicine

## 2023-08-14 ENCOUNTER — Encounter: Payer: Self-pay | Admitting: Internal Medicine

## 2023-08-26 ENCOUNTER — Telehealth: Payer: PRIVATE HEALTH INSURANCE | Admitting: Family Medicine

## 2023-08-26 DIAGNOSIS — N390 Urinary tract infection, site not specified: Secondary | ICD-10-CM | POA: Diagnosis not present

## 2023-08-26 MED ORDER — SULFAMETHOXAZOLE-TRIMETHOPRIM 800-160 MG PO TABS: 1.0000 | ORAL_TABLET | Freq: Two times a day (BID) | ORAL | 0 refills | Status: AC

## 2023-08-26 NOTE — Progress Notes (Signed)
E-Visit for Urinary Problems  We are sorry that you are not feeling well.  Here is how we plan to help!  Based on what you shared with me it looks like you most likely have a simple urinary tract infection.  A UTI (Urinary Tract Infection) is a bacterial infection of the bladder.  Most cases of urinary tract infections are simple to treat but a key part of your care is to encourage you to drink plenty of fluids and watch your symptoms carefully.  I have prescribed Bactrim DS One tablet twice a day for 5 days.  Your symptoms should gradually improve. Call us if the burning in your urine worsens, you develop worsening fever, back pain or pelvic pain or if your symptoms do not resolve after completing the antibiotic.  Urinary tract infections can be prevented by drinking plenty of water to keep your body hydrated.  Also be sure when you wipe, wipe from front to back and don't hold it in!  If possible, empty your bladder every 4 hours.  HOME CARE Drink plenty of fluids Compete the full course of the antibiotics even if the symptoms resolve Remember, when you need to go.go. Holding in your urine can increase the likelihood of getting a UTI! GET HELP RIGHT AWAY IF: You cannot urinate You get a  fever Worsening back pain occurs You see blood in your urine You feel sick to your stomach or throw up You feel like you are going to pass out  MAKE SURE YOU  Understand these instructions. Will watch your condition. Will get help right away if you are not doing well or get worse.   Thank you for choosing an e-visit.  Your e-visit answers were reviewed by a board certified advanced clinical practitioner to complete your personal care plan. Depending upon the condition, your plan could have included both over the counter or prescription medications.  Please review your pharmacy choice. Make sure the pharmacy is open so you can pick up prescription now. If there is a problem, you may contact  your provider through MyChart messaging and have the prescription routed to another pharmacy.  Your safety is important to us. If you have drug allergies check your prescription carefully.   For the next 24 hours you can use MyChart to ask questions about today's visit, request a non-urgent call back, or ask for a work or school excuse. You will get an email in the next two days asking about your experience. I hope that your e-visit has been valuable and will speed your recovery.   have provided 5 minutes of non face to face time during this encounter for chart review and documentation.    

## 2023-09-10 ENCOUNTER — Encounter: Payer: PRIVATE HEALTH INSURANCE | Admitting: Internal Medicine

## 2023-09-23 ENCOUNTER — Ambulatory Visit (HOSPITAL_COMMUNITY)
Admission: RE | Admit: 2023-09-23 | Discharge: 2023-09-23 | Disposition: A | Discharge status: Home / Self Care | Payer: PRIVATE HEALTH INSURANCE | Source: Ambulatory Visit | Attending: Family Medicine | Admitting: Family Medicine

## 2023-09-23 ENCOUNTER — Encounter (HOSPITAL_COMMUNITY): Payer: Self-pay

## 2023-09-23 VITALS — BP 146/97 | HR 97 | Temp 97.6°F | Resp 14

## 2023-09-23 DIAGNOSIS — N9089 Other specified noninflammatory disorders of vulva and perineum: Secondary | ICD-10-CM | POA: Insufficient documentation

## 2023-09-23 DIAGNOSIS — L02419 Cutaneous abscess of limb, unspecified: Secondary | ICD-10-CM | POA: Insufficient documentation

## 2023-09-23 DIAGNOSIS — Z113 Encounter for screening for infections with a predominantly sexual mode of transmission: Secondary | ICD-10-CM | POA: Diagnosis not present

## 2023-09-23 DIAGNOSIS — L03119 Cellulitis of unspecified part of limb: Secondary | ICD-10-CM | POA: Diagnosis present

## 2023-09-23 LAB — RPR: RPR Ser Ql: NONREACTIVE

## 2023-09-23 LAB — HSV 1/2 PCR (SURFACE)
HSV-1 DNA: NOT DETECTED
HSV-2 DNA: NOT DETECTED

## 2023-09-23 LAB — HIV ANTIBODY (ROUTINE TESTING W REFLEX): HIV Screen 4th Generation wRfx: NONREACTIVE

## 2023-09-23 MED ORDER — DOXYCYCLINE HYCLATE 100 MG PO CAPS: 100.0000 mg | ORAL_CAPSULE | Freq: Two times a day (BID) | ORAL | 0 refills | Status: AC

## 2023-09-23 NOTE — ED Notes (Signed)
 At bedside with Dr Brannon Calamity while collecting samples for HSV testing.

## 2023-09-23 NOTE — ED Triage Notes (Signed)
 Patient states she has open sores on the right inner thigh, right upper thigh, buttocks. Patient states she is concerned for Herpes. Patient also reports that she is wanting to be tested for other STI's.  Patient also reports that she has a "hair bump" at the pubic area.

## 2023-09-23 NOTE — Discharge Instructions (Signed)
 You were seen today for several sores on the leg/labial area.  I have swabbed these for herpes, and this should be resulted in the next several days.  You vaginal swab and blood work will be resulted in several days as well.  If anything is abnormal you will be notified for treatment.  I have sent out an oral antibiotic today to treat for possible abscess/cellulitis.  Please keep the areas clean and covered.  Return if not improving.

## 2023-09-23 NOTE — ED Provider Notes (Signed)
 [facility]    CSN: [id number] Arrival date & time: 09/23/23  0850      History   Chief Complaint Chief Complaint  Patient presents with   open sores    HPI Tanya Torres is a 28 y.o. female.   Patient is here for open sores to the right inner, upper thigh and buttocks. Worried it may be herpes.  She thought initially it was an abscess about 4 days ago.  They just opened on their own.  Starting to feel another on the left buttocks.  No known STD exposures.  No vaginal symptoms at this time.  She did have some vaginal d/c several days ago, and took diflucan  for that, which that did resolve.  LMP was 4/5.        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 Authorizing Provider  acetaminophen  (TYLENOL ) 325 MG tablet Take 650 mg by mouth every 6 (six) hours as needed for mild pain or headache.    [provider]  Blood Glucose Monitoring Suppl DEVI 1 each by Does not apply route in the morning, at noon, and at bedtime. May substitute to any manufacturer covered by patient's insurance. 06/23/22   Raspet, Ajax Schroll K, PA-C  fluconazole  (DIFLUCAN ) 150 MG tablet 1 pill every 72 hours for 2 weeks, then 1 pill  weekly for 1 month Patient not taking: Reported on 03/15/2023 02/20/23   Adelia Homestead, MD  insulin  glargine (LANTUS ) 100 UNIT/ML Solostar Pen Inject 5-25 Units into the skin at bedtime. 02/20/23   Adelia Homestead, MD  Insulin  Pen Needle (PEN NEEDLES) 30G X 8 MM MISC 1 Needle by Does not apply route daily. 06/23/22   Raspet, Kamonte Mcmichen K, PA-C  metFORMIN  (GLUCOPHAGE -XR) 750 MG 24 hr tablet Take 3 tablets (2,250 mg total) by mouth daily with breakfast. 02/20/23   Adelia Homestead, MD  Prenatal MV-Min-FA-Omega-3 (PRENATAL GUMMIES/DHA & FA) 0.4-32.5 MG CHEW Chew 3 tablets by mouth daily. 03/15/23   Izell Marsh, MD    Family History Family History  Problem Relation Age of Onset   Hypertension Mother    Heart attack Mother    Diabetes Mother    Hypertension Father    Diabetes Father     Social History Social History   Tobacco Use   Smoking status: Never   Smokeless tobacco: Never  Vaping Use   Vaping  status: Never Used  Substance Use Topics   Alcohol use: Not Currently    Comment: occasionally   Drug use: No     Allergies   Soap   Review of Systems Review of Systems  Constitutional: Negative.   HENT: Negative.    Respiratory: Negative.    Gastrointestinal: Negative.   Genitourinary: Negative.   Skin:  Positive for wound.     Physical Exam Triage Vital Signs ED Triage Vitals  Encounter Vitals Group     BP 09/23/23 0912 (!) 146/97     Systolic BP Percentile --      Diastolic BP Percentile --      Pulse Rate 09/23/23 0912 97     Resp 09/23/23 0912 14     Temp 09/23/23 0912 97.6 F (36.4 C)     Temp Source 09/23/23 0912 Oral     SpO2 09/23/23 0912 98 %     Weight --      Height --      Head Circumference --      Peak Flow --      Pain Score 09/23/23 0911 5     Pain Loc --      Pain Education --      Exclude from Growth Chart --    No data found.  Updated Vital Signs BP (!) 146/97 (BP Location: Left Arm)   Pulse 97   Temp 97.6 F (36.4  C) (Oral)   Resp 14   LMP 08/31/2023   SpO2 98%   Breastfeeding Unknown   Visual Acuity Right Eye Distance:   Left Eye Distance:   Bilateral Distance:    Right Eye Near:   Left Eye Near:    Bilateral Near:     Physical Exam Constitutional:      Appearance: Normal appearance. She is normal weight.  Skin:    Comments: At the right medial thigh to the back of the leg are 3 moderate in sized sores that have opened and are crusted;  minimal surrounding erythema;  no abscess noted;  mildly tender;  At the labia are several very small sores;  not ulcerative per se;  tender  Neurological:     General: No focal deficit present.     Mental Status: She is alert.  Psychiatric:        Mood and Affect: Mood normal.      UC Treatments / Results  Labs (all labs ordered are listed, but only abnormal results are displayed) Labs Reviewed  HSV 1/2 PCR (SURFACE)  RPR  HIV ANTIBODY (ROUTINE TESTING W REFLEX)  CERVICOVAGINAL ANCILLARY ONLY    EKG   Radiology No results found.  Procedures Procedures (including critical care time)  Medications Ordered in UC Medications - No data to display  Initial Impression / Assessment and Plan / UC Course  I have reviewed the triage vital signs and the nursing notes.  Pertinent labs & imaging results that were available during my care of the patient were reviewed by me and considered in my medical decision making (see chart for details).   Final Clinical Impressions(s) / UC Diagnoses   Final diagnoses:  Labial lesion  Cellulitis and abscess of leg, except foot  Screening for STD (sexually transmitted disease)     Discharge Instructions      You were seen today for several sores on the leg/labial area.  I have swabbed these for herpes, and this should be resulted in the next several days.  You  vaginal swab and blood work will be resulted in several days as well.  If anything is abnormal you will be notified for treatment.  I have  sent out an oral antibiotic today to treat for possible abscess/cellulitis.  Please keep the areas clean and covered.  Return if not improving.     ED Prescriptions     Medication Sig Dispense Auth. Provider   doxycycline (VIBRAMYCIN) 100 MG capsule Take 1 capsule (100 mg total) by mouth 2 (two) times daily. 20 capsule Lesle Ras, MD      PDMP not reviewed this encounter.   Lesle Ras, MD 09/23/23 [id number]

## 2023-09-24 LAB — CERVICOVAGINAL ANCILLARY ONLY
Bacterial Vaginitis (gardnerella): POSITIVE — AB
Candida Glabrata: POSITIVE — AB
Candida Vaginitis: POSITIVE — AB
Chlamydia: NEGATIVE
Comment: NEGATIVE
Comment: NEGATIVE
Comment: NEGATIVE
Comment: NEGATIVE
Comment: NEGATIVE
Comment: NORMAL
Neisseria Gonorrhea: NEGATIVE
Trichomonas: NEGATIVE

## 2023-09-25 ENCOUNTER — Telehealth (HOSPITAL_COMMUNITY): Payer: Self-pay

## 2023-09-25 MED ORDER — FLUCONAZOLE 150 MG PO TABS: 150.0000 mg | ORAL_TABLET | Freq: Once | ORAL | 0 refills | Status: AC

## 2023-10-17 ENCOUNTER — Other Ambulatory Visit: Payer: Self-pay | Admitting: Internal Medicine

## 2023-12-01 ENCOUNTER — Encounter (HOSPITAL_COMMUNITY): Payer: Self-pay

## 2023-12-01 ENCOUNTER — Ambulatory Visit (INDEPENDENT_AMBULATORY_CARE_PROVIDER_SITE_OTHER): Payer: PRIVATE HEALTH INSURANCE

## 2023-12-01 ENCOUNTER — Ambulatory Visit (HOSPITAL_COMMUNITY)
Admission: EM | Admit: 2023-12-01 | Discharge: 2023-12-01 | Disposition: A | Discharge status: Home / Self Care | Payer: PRIVATE HEALTH INSURANCE | Attending: Internal Medicine | Admitting: Internal Medicine

## 2023-12-01 DIAGNOSIS — S93402A Sprain of unspecified ligament of left ankle, initial encounter: Secondary | ICD-10-CM

## 2023-12-01 DIAGNOSIS — M25572 Pain in left ankle and joints of left foot: Secondary | ICD-10-CM | POA: Diagnosis not present

## 2023-12-01 MED ORDER — IBUPROFEN 800 MG PO TABS
800.0000 mg | ORAL_TABLET | Freq: Once | ORAL | Status: AC
  Administered 2023-12-01: 800 mg via ORAL

## 2023-12-01 MED ORDER — IBUPROFEN 800 MG PO TABS
ORAL_TABLET | ORAL | Status: AC
  Filled 2023-12-01: qty 1

## 2023-12-01 NOTE — ED Triage Notes (Signed)
 Pt c/o pain and swelling to L lateral ankle s/p mechanical fall last night. Reports she was walking down porch steps when she twisted her ankle and fell. Denies other concerning injuries. No relief with tylenol  and elevation.

## 2023-12-01 NOTE — ED Provider Notes (Signed)
 [facility]    CSN: [id number] Arrival date & time: 12/01/23  1003      History   Chief Complaint Chief Complaint  Patient presents with   Ankle Pain    HPI Tanya Torres is a 28 y.o. female.   Tanya Torres is a 28 y.o. female presenting for chief complaint of Ankle Pain that started last night as a result of injury.  She missed a step while walking down a few steps and ended up twisting her left ankle resulting in left lateral malleolus pain and swelling.  She was able to bear weight on the left ankle immediately after injury and pain/swelling worsened overnight last night.  Denies previous injury to the left ankle, paresthesias distally to swelling/pain, and laceration/abrasion associated with injury.  She has taken Tylenol  with minimal relief of pain and swelling.   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 Authorizing Provider  acetaminophen  (TYLENOL ) 325 MG tablet Take 650 mg by mouth every 6 (six) hours as needed for mild pain or headache.    [provider]  Blood Glucose Monitoring Suppl DEVI 1 each by Does not apply route in the morning, at noon, and at bedtime. May substitute to  any manufacturer covered by patient's insurance. 06/23/22   Raspet, Erin K, PA-C  doxycycline  (VIBRAMYCIN ) 100 MG capsule Take 1 capsule (100 mg total) by mouth 2 (two) times daily. 09/23/23   Darral Longs, MD  fluconazole  (DIFLUCAN ) 150 MG tablet 1 pill every 72 hours for 2 weeks, then 1 pill weekly for 1 month Patient not taking: Reported on 03/15/2023 02/20/23   Rollene Almarie LABOR, MD  insulin  glargine (LANTUS ) 100 UNIT/ML Solostar Pen Inject 5-25 Units into the skin at bedtime. 02/20/23   Rollene Almarie LABOR, MD  Insulin  Pen Needle (PEN NEEDLES) 30G X 8 MM MISC 1 Needle by Does not apply route daily. 06/23/22   Raspet, Erin K, PA-C  metFORMIN  (GLUCOPHAGE -XR) 750 MG 24 hr tablet TAKE 2 TABLETS BY MOUTH ONCE DAILY WITH  BREAKFAST 10/17/23   Rollene Almarie LABOR, MD    Family History Family History  Problem Relation Age of Onset   Hypertension Mother    Heart attack Mother    Diabetes Mother    Hypertension Father    Diabetes Father     Social History Social History   Tobacco Use  Smoking status: Never   Smokeless tobacco: Never  Vaping Use   Vaping status: Never Used  Substance Use Topics   Alcohol use: Not Currently    Comment: occasionally   Drug use: No     Allergies   Soap   Review of Systems Review of Systems Per HPI  Physical Exam Triage Vital Signs ED Triage Vitals  Encounter Vitals Group     BP 12/01/23 1017 (!) 150/107     Girls Systolic BP Percentile --      Girls Diastolic BP Percentile --      Boys Systolic BP Percentile --      Boys Diastolic BP Percentile --      Pulse Rate 12/01/23 1017 (!) 101     Resp 12/01/23 1017 18     Temp 12/01/23 1017 98.8 F (37.1 C)     Temp Source 12/01/23 1017 Oral     SpO2 --      Weight --      Height --      Head Circumference --      Peak Flow --      Pain Score 12/01/23 1014 10     Pain Loc --      Pain Education --      Exclude from Growth Chart --    No data found.  Updated Vital Signs BP (!)  150/107 (BP Location: Left Arm)   Pulse (!) 101   Temp 98.8 F (37.1 C) (Oral)   Resp 18   LMP 11/17/2023 (Exact Date)   Visual Acuity Right Eye Distance:   Left Eye Distance:   Bilateral Distance:    Right Eye Near:   Left Eye Near:    Bilateral Near:     Physical Exam Vitals and nursing note reviewed.  Constitutional:      Appearance: She is not ill-appearing or toxic-appearing.  HENT:     Head: Normocephalic and atraumatic.     Right Ear: Hearing and external ear normal.     Left Ear: Hearing and external ear normal.     Nose: Nose normal.     Mouth/Throat:     Lips: Pink.  Eyes:     General: Lids are normal. Vision grossly intact. Gaze aligned appropriately.     Extraocular Movements: Extraocular movements intact.     Conjunctiva/sclera: Conjunctivae normal.  Pulmonary:     Effort: Pulmonary effort is normal.  Musculoskeletal:     Cervical back: Neck supple.     Right ankle: Normal.     Left ankle: Swelling and ecchymosis (Minimal bruising to the left lateral malleolus) present. No deformity or lacerations. Tenderness present over the lateral malleolus (Tender to the diffuse lateral malleolus most prominent over the superior portion of the lateral malleolus) and base of 5th metatarsal. No medial malleolus, ATF ligament, AITF ligament or proximal fibula tenderness. Normal range of motion. Normal pulse (+2 left anterior tibialis pulse).     Left Achilles Tendon: Normal.     Right foot: Normal.     Left foot: Normal.     Comments: 5/5 strength against resistance with dorsiflexion and plantarflexion of the bilateral lower extremities.  Sensation intact distally.  Skin:    General: Skin is warm and dry.     Capillary Refill: Capillary refill takes less than 2 seconds.     Findings: No rash.  Neurological:     General: No focal deficit present.     Mental Status: She is alert and  oriented to person, place, and time. Mental status is at baseline.     Cranial Nerves: No  dysarthria or facial asymmetry.  Psychiatric:        Mood and Affect: Mood normal.        Speech: Speech normal.        Behavior: Behavior normal.        Thought Content: Thought content normal.        Judgment: Judgment normal.      UC Treatments / Results  Labs (all labs ordered are listed, but only abnormal results are displayed) Labs Reviewed - No data to display  EKG   Radiology DG Ankle Complete Left Result Date: 12/01/2023 CLINICAL DATA:  Fall last night with twisting injury. Lateral ankle pain. EXAM: LEFT ANKLE COMPLETE - 3+ VIEW COMPARISON:  None Available. FINDINGS: The mineralization and alignment are normal. There is no evidence of acute fracture or dislocation. The joint spaces are preserved. There is mild anterolateral soft tissue swelling without evidence of foreign body or soft tissue emphysema. IMPRESSION: Mild anterolateral soft tissue swelling without evidence of acute fracture or dislocation. Electronically Signed   By: Elsie Perone M.D.   On: 12/01/2023 10:55    Procedures Procedures (including critical care time)  Medications Ordered in UC Medications  ibuprofen  (ADVIL ) tablet 800 mg (800 mg Oral Given 12/01/23 1100)    Initial Impression / Assessment and Plan / UC Course  I have reviewed the triage vital signs and the nursing notes.  Pertinent labs & imaging results that were available during my care of the patient were reviewed by me and considered in my medical decision making (see chart for details).   1.  Acute left ankle pain, sprain of left ankle Imaging is negative for acute fracture or dislocation. We will manage this with conservative treatment as an acute sprain to the left ankle.  RICE advised.   Cam boot applied in clinic and patient advised to wear this daily for the next 7-10 days for support, supportive shoes after this.  May use ibuprofen  as needed for pain and swelling at home.  Walking referral to orthopedics given should symptoms  fail to improve in the next few weeks with conservative care.   Counseled patient on potential for adverse effects with medications prescribed/recommended today, strict ER and return-to-clinic precautions discussed, patient verbalized understanding.    Final Clinical Impressions(s) / UC Diagnoses   Final diagnoses:  Acute left ankle pain  Sprain of left ankle, unspecified ligament, initial encounter     Discharge Instructions      Your x-rays of your ankle were negative for fracture or dislocation. You likely sprained your ankle.   Wear the cam walker boot we provided in the clinic for the next couple of weeks to provide compression, stability, and comfort for the next 7 to 10 days.  Please rest, ice, and elevate your ankle to help it heal and decrease inflammation.   Take 600mg  ibuprofen  and/or 1,000mg  tylenol  every 6 hours as needed for pain and inflammation. Take with food to avoid stomach upset.  Call the orthopedic provider listed on your discharge paperwork to schedule a follow-up appointment if your symptoms do not improve in the next 1-2 weeks with supportive care.  Return to urgent care if you experience worsening pain, numbness, tingling, change of color in your skin near the injury, or any other concerning symptoms.  I hope you feel better!      ED Prescriptions   None  PDMP not reviewed this encounter.   Enedelia Dorna HERO, [place] 12/01/23 1114

## 2023-12-01 NOTE — Discharge Instructions (Signed)
 Your x-rays of your ankle were negative for fracture or dislocation. You likely sprained your ankle.   Wear the cam walker boot we provided in the clinic for the next couple of weeks to provide compression, stability, and comfort for the next 7 to 10 days.  Please rest, ice, and elevate your ankle to help it heal and decrease inflammation.   Take 600mg  ibuprofen  and/or 1,000mg  tylenol  every 6 hours as needed for pain and inflammation. Take with food to avoid stomach upset.  Call the orthopedic provider listed on your discharge paperwork to schedule a follow-up appointment if your symptoms do not improve in the next 1-2 weeks with supportive care.  Return to urgent care if you experience worsening pain, numbness, tingling, change of color in your skin near the injury, or any other concerning symptoms.  I hope you feel better!

## 2024-01-14 ENCOUNTER — Inpatient Hospital Stay: Payer: PRIVATE HEALTH INSURANCE | Admitting: Internal Medicine

## 2024-02-13 ENCOUNTER — Telehealth: Payer: PRIVATE HEALTH INSURANCE | Admitting: Physician Assistant

## 2024-02-13 DIAGNOSIS — T3695XA Adverse effect of unspecified systemic antibiotic, initial encounter: Secondary | ICD-10-CM

## 2024-02-13 DIAGNOSIS — B379 Candidiasis, unspecified: Secondary | ICD-10-CM

## 2024-02-13 MED ORDER — FLUCONAZOLE 150 MG PO TABS: ORAL_TABLET | ORAL | 0 refills | Status: DC

## 2024-02-13 MED ORDER — CEPHALEXIN 500 MG PO CAPS: 500.0000 mg | ORAL_CAPSULE | Freq: Two times a day (BID) | ORAL | 0 refills | Status: AC

## 2024-02-13 NOTE — Progress Notes (Signed)

## 2024-02-13 NOTE — Progress Notes (Signed)
 I have spent 5 minutes in review of e-visit questionnaire, review and updating patient chart, medical decision making and response to patient.   Elsie Velma Lunger, PA-C

## 2024-03-09 ENCOUNTER — Other Ambulatory Visit: Payer: Self-pay | Admitting: Internal Medicine

## 2024-04-06 ENCOUNTER — Encounter: Payer: PRIVATE HEALTH INSURANCE | Admitting: Internal Medicine

## 2024-06-11 ENCOUNTER — Telehealth: Payer: PRIVATE HEALTH INSURANCE | Admitting: Physician Assistant

## 2024-06-11 DIAGNOSIS — N760 Acute vaginitis: Secondary | ICD-10-CM

## 2024-06-11 NOTE — Progress Notes (Signed)
" ° °  Thank you for the details you included in the comment boxes. Those details are very helpful in determining the best course of treatment for you and help us  to provide the best care. Because you are requesting a medication refill and treatment for 2 separate acute issues, we recommend that you schedule a Virtual Urgent Care video visit in order for the provider to better assess what is going on.  The provider will be able to give you a more accurate diagnosis and treatment plan if we can more freely discuss your symptoms and with the addition of a virtual examination.   If you change your visit to a video visit, we will bill your insurance (similar to an office visit) and you will not be charged for this e-Visit. You will be able to stay at home and speak with the first available [facility] advanced practice provider. The link to do a video visit is in the drop down Menu tab of your Welcome screen in MyChart.    "
